# Patient Record
Sex: Female | Born: 1960
Health system: Southern US, Community
[De-identification: ages and names within clinical notes are randomized; demographics above are authoritative.]

## PROBLEM LIST (undated history)

## (undated) DIAGNOSIS — E785 Hyperlipidemia, unspecified: Secondary | ICD-10-CM

## (undated) DIAGNOSIS — I10 Essential (primary) hypertension: Secondary | ICD-10-CM

## (undated) DIAGNOSIS — H348122 Central retinal vein occlusion, left eye, stable: Secondary | ICD-10-CM

## (undated) HISTORY — DX: Hyperlipidemia, unspecified: E78.5

## (undated) HISTORY — DX: Essential (primary) hypertension: I10

## (undated) HISTORY — DX: Central retinal vein occlusion, left eye, stable: H34.8122

---

## 2001-10-16 HISTORY — PX: CHOLECYSTECTOMY: SHX55

## 2006-10-16 HISTORY — PX: MYOMECTOMY: SHX85

## 2011-02-03 DIAGNOSIS — I10 Essential (primary) hypertension: Secondary | ICD-10-CM | POA: Insufficient documentation

## 2011-06-27 DIAGNOSIS — H5210 Myopia, unspecified eye: Secondary | ICD-10-CM | POA: Insufficient documentation

## 2013-12-19 ENCOUNTER — Ambulatory Visit
Admission: RE | Admit: 2013-12-19 | Discharge: 2013-12-19 | Disposition: A | Discharge status: Home / Self Care | Payer: Federal, State, Local not specified - PPO | Source: Ambulatory Visit | Attending: Family Medicine | Admitting: Family Medicine

## 2013-12-19 ENCOUNTER — Other Ambulatory Visit: Payer: Self-pay | Admitting: Family Medicine

## 2013-12-19 DIAGNOSIS — R0989 Other specified symptoms and signs involving the circulatory and respiratory systems: Secondary | ICD-10-CM

## 2014-01-06 ENCOUNTER — Ambulatory Visit
Admission: RE | Admit: 2014-01-06 | Discharge: 2014-01-06 | Disposition: A | Discharge status: Home / Self Care | Payer: Federal, State, Local not specified - PPO | Source: Ambulatory Visit | Attending: Family Medicine | Admitting: Family Medicine

## 2014-01-06 ENCOUNTER — Other Ambulatory Visit: Payer: Self-pay | Admitting: Family Medicine

## 2014-01-06 DIAGNOSIS — J189 Pneumonia, unspecified organism: Secondary | ICD-10-CM

## 2016-03-02 DIAGNOSIS — H1851 Endothelial corneal dystrophy: Secondary | ICD-10-CM | POA: Diagnosis not present

## 2016-03-02 DIAGNOSIS — H5213 Myopia, bilateral: Secondary | ICD-10-CM | POA: Diagnosis not present

## 2016-08-08 DIAGNOSIS — J3089 Other allergic rhinitis: Secondary | ICD-10-CM | POA: Diagnosis not present

## 2016-08-08 DIAGNOSIS — I158 Other secondary hypertension: Secondary | ICD-10-CM | POA: Diagnosis not present

## 2016-08-17 DIAGNOSIS — E042 Nontoxic multinodular goiter: Secondary | ICD-10-CM | POA: Diagnosis not present

## 2016-08-17 DIAGNOSIS — R490 Dysphonia: Secondary | ICD-10-CM | POA: Diagnosis not present

## 2016-10-31 DIAGNOSIS — J3089 Other allergic rhinitis: Secondary | ICD-10-CM | POA: Diagnosis not present

## 2016-10-31 DIAGNOSIS — J310 Chronic rhinitis: Secondary | ICD-10-CM | POA: Insufficient documentation

## 2016-10-31 DIAGNOSIS — Z6833 Body mass index (BMI) 33.0-33.9, adult: Secondary | ICD-10-CM | POA: Diagnosis not present

## 2016-11-09 DIAGNOSIS — J111 Influenza due to unidentified influenza virus with other respiratory manifestations: Secondary | ICD-10-CM | POA: Diagnosis not present

## 2016-11-09 DIAGNOSIS — R05 Cough: Secondary | ICD-10-CM | POA: Diagnosis not present

## 2016-12-06 DIAGNOSIS — J069 Acute upper respiratory infection, unspecified: Secondary | ICD-10-CM | POA: Diagnosis not present

## 2016-12-10 DIAGNOSIS — B309 Viral conjunctivitis, unspecified: Secondary | ICD-10-CM | POA: Diagnosis not present

## 2016-12-10 DIAGNOSIS — J019 Acute sinusitis, unspecified: Secondary | ICD-10-CM | POA: Diagnosis not present

## 2016-12-22 DIAGNOSIS — E785 Hyperlipidemia, unspecified: Secondary | ICD-10-CM | POA: Diagnosis not present

## 2016-12-22 DIAGNOSIS — I10 Essential (primary) hypertension: Secondary | ICD-10-CM | POA: Diagnosis not present

## 2017-03-06 DIAGNOSIS — L908 Other atrophic disorders of skin: Secondary | ICD-10-CM | POA: Diagnosis not present

## 2017-03-06 DIAGNOSIS — D229 Melanocytic nevi, unspecified: Secondary | ICD-10-CM | POA: Diagnosis not present

## 2017-03-06 DIAGNOSIS — L820 Inflamed seborrheic keratosis: Secondary | ICD-10-CM | POA: Diagnosis not present

## 2017-04-05 DIAGNOSIS — H1851 Endothelial corneal dystrophy: Secondary | ICD-10-CM | POA: Diagnosis not present

## 2017-04-05 DIAGNOSIS — H5213 Myopia, bilateral: Secondary | ICD-10-CM | POA: Diagnosis not present

## 2017-09-21 DIAGNOSIS — J310 Chronic rhinitis: Secondary | ICD-10-CM | POA: Diagnosis not present

## 2017-09-21 DIAGNOSIS — J3089 Other allergic rhinitis: Secondary | ICD-10-CM | POA: Diagnosis not present

## 2017-09-21 DIAGNOSIS — Z6833 Body mass index (BMI) 33.0-33.9, adult: Secondary | ICD-10-CM | POA: Diagnosis not present

## 2018-03-01 DIAGNOSIS — I10 Essential (primary) hypertension: Secondary | ICD-10-CM | POA: Diagnosis not present

## 2018-03-01 DIAGNOSIS — Z8379 Family history of other diseases of the digestive system: Secondary | ICD-10-CM | POA: Diagnosis not present

## 2018-03-01 DIAGNOSIS — E785 Hyperlipidemia, unspecified: Secondary | ICD-10-CM | POA: Diagnosis not present

## 2018-03-01 DIAGNOSIS — E669 Obesity, unspecified: Secondary | ICD-10-CM | POA: Diagnosis not present

## 2018-03-06 ENCOUNTER — Other Ambulatory Visit: Payer: Self-pay | Admitting: Family Medicine

## 2018-03-06 DIAGNOSIS — Z8379 Family history of other diseases of the digestive system: Secondary | ICD-10-CM

## 2018-03-21 ENCOUNTER — Other Ambulatory Visit: Payer: Federal, State, Local not specified - PPO

## 2018-03-26 ENCOUNTER — Ambulatory Visit
Admission: RE | Admit: 2018-03-26 | Discharge: 2018-03-26 | Disposition: A | Discharge status: Home / Self Care | Payer: Federal, State, Local not specified - PPO | Source: Ambulatory Visit | Attending: Family Medicine | Admitting: Family Medicine

## 2018-03-26 DIAGNOSIS — Z8379 Family history of other diseases of the digestive system: Secondary | ICD-10-CM

## 2018-03-26 DIAGNOSIS — K760 Fatty (change of) liver, not elsewhere classified: Secondary | ICD-10-CM | POA: Diagnosis not present

## 2018-06-11 DIAGNOSIS — E785 Hyperlipidemia, unspecified: Secondary | ICD-10-CM | POA: Diagnosis not present

## 2018-08-13 DIAGNOSIS — Z79899 Other long term (current) drug therapy: Secondary | ICD-10-CM | POA: Diagnosis not present

## 2018-08-13 DIAGNOSIS — E785 Hyperlipidemia, unspecified: Secondary | ICD-10-CM | POA: Diagnosis not present

## 2018-09-17 DIAGNOSIS — H34832 Tributary (branch) retinal vein occlusion, left eye, with macular edema: Secondary | ICD-10-CM | POA: Diagnosis not present

## 2018-09-17 DIAGNOSIS — H1851 Endothelial corneal dystrophy: Secondary | ICD-10-CM | POA: Diagnosis not present

## 2018-09-18 DIAGNOSIS — I10 Essential (primary) hypertension: Secondary | ICD-10-CM | POA: Diagnosis not present

## 2018-09-26 DIAGNOSIS — H1851 Endothelial corneal dystrophy: Secondary | ICD-10-CM | POA: Diagnosis not present

## 2018-09-26 DIAGNOSIS — H34832 Tributary (branch) retinal vein occlusion, left eye, with macular edema: Secondary | ICD-10-CM | POA: Diagnosis not present

## 2018-09-27 DIAGNOSIS — S0502XA Injury of conjunctiva and corneal abrasion without foreign body, left eye, initial encounter: Secondary | ICD-10-CM | POA: Diagnosis not present

## 2018-09-27 DIAGNOSIS — H34832 Tributary (branch) retinal vein occlusion, left eye, with macular edema: Secondary | ICD-10-CM | POA: Diagnosis not present

## 2018-10-04 DIAGNOSIS — I10 Essential (primary) hypertension: Secondary | ICD-10-CM | POA: Diagnosis not present

## 2018-10-04 DIAGNOSIS — E785 Hyperlipidemia, unspecified: Secondary | ICD-10-CM | POA: Diagnosis not present

## 2018-10-07 DIAGNOSIS — Z8249 Family history of ischemic heart disease and other diseases of the circulatory system: Secondary | ICD-10-CM | POA: Diagnosis not present

## 2018-10-07 DIAGNOSIS — I10 Essential (primary) hypertension: Secondary | ICD-10-CM | POA: Diagnosis not present

## 2018-10-07 DIAGNOSIS — Z0189 Encounter for other specified special examinations: Secondary | ICD-10-CM | POA: Diagnosis not present

## 2018-10-07 DIAGNOSIS — E782 Mixed hyperlipidemia: Secondary | ICD-10-CM | POA: Diagnosis not present

## 2018-11-01 DIAGNOSIS — Z8249 Family history of ischemic heart disease and other diseases of the circulatory system: Secondary | ICD-10-CM | POA: Diagnosis not present

## 2018-11-01 DIAGNOSIS — I10 Essential (primary) hypertension: Secondary | ICD-10-CM | POA: Diagnosis not present

## 2018-11-07 DIAGNOSIS — H43393 Other vitreous opacities, bilateral: Secondary | ICD-10-CM | POA: Diagnosis not present

## 2018-11-07 DIAGNOSIS — H1851 Endothelial corneal dystrophy: Secondary | ICD-10-CM | POA: Diagnosis not present

## 2018-11-07 DIAGNOSIS — H34832 Tributary (branch) retinal vein occlusion, left eye, with macular edema: Secondary | ICD-10-CM | POA: Diagnosis not present

## 2018-11-08 DIAGNOSIS — E785 Hyperlipidemia, unspecified: Secondary | ICD-10-CM | POA: Diagnosis not present

## 2018-11-08 DIAGNOSIS — Z0189 Encounter for other specified special examinations: Secondary | ICD-10-CM | POA: Diagnosis not present

## 2018-11-08 DIAGNOSIS — Z23 Encounter for immunization: Secondary | ICD-10-CM | POA: Diagnosis not present

## 2018-11-08 DIAGNOSIS — Z8249 Family history of ischemic heart disease and other diseases of the circulatory system: Secondary | ICD-10-CM | POA: Diagnosis not present

## 2018-11-08 DIAGNOSIS — I10 Essential (primary) hypertension: Secondary | ICD-10-CM | POA: Diagnosis not present

## 2018-11-08 DIAGNOSIS — E782 Mixed hyperlipidemia: Secondary | ICD-10-CM | POA: Diagnosis not present

## 2018-11-08 DIAGNOSIS — R739 Hyperglycemia, unspecified: Secondary | ICD-10-CM | POA: Diagnosis not present

## 2018-11-29 ENCOUNTER — Other Ambulatory Visit: Payer: Self-pay | Admitting: Cardiology

## 2018-11-29 DIAGNOSIS — I10 Essential (primary) hypertension: Secondary | ICD-10-CM

## 2018-12-04 ENCOUNTER — Other Ambulatory Visit (HOSPITAL_COMMUNITY): Payer: Self-pay | Admitting: Cardiology

## 2018-12-04 DIAGNOSIS — I10 Essential (primary) hypertension: Secondary | ICD-10-CM | POA: Diagnosis not present

## 2018-12-05 DIAGNOSIS — H1851 Endothelial corneal dystrophy: Secondary | ICD-10-CM | POA: Diagnosis not present

## 2018-12-05 DIAGNOSIS — H43393 Other vitreous opacities, bilateral: Secondary | ICD-10-CM | POA: Diagnosis not present

## 2018-12-05 DIAGNOSIS — H34832 Tributary (branch) retinal vein occlusion, left eye, with macular edema: Secondary | ICD-10-CM | POA: Diagnosis not present

## 2018-12-05 LAB — BASIC METABOLIC PANEL
BUN/Creatinine Ratio: 18 (ref 9–23)
BUN: 12 mg/dL (ref 6–24)
CO2: 26 mmol/L (ref 20–29)
Calcium: 9.6 mg/dL (ref 8.7–10.2)
Chloride: 101 mmol/L (ref 96–106)
Creatinine, Ser: 0.65 mg/dL (ref 0.57–1.00)
GFR, EST AFRICAN AMERICAN: 114 mL/min/{1.73_m2} (ref >59)
GFR, EST NON AFRICAN AMERICAN: 99 mL/min/{1.73_m2} (ref >59)
Glucose: 103 mg/dL — ABNORMAL HIGH (ref 65–99)
Potassium: 4.4 mmol/L (ref 3.5–5.2)
Sodium: 140 mmol/L (ref 134–144)

## 2018-12-06 ENCOUNTER — Encounter: Payer: Self-pay | Admitting: Cardiology

## 2018-12-06 ENCOUNTER — Ambulatory Visit: Payer: Federal, State, Local not specified - PPO | Admitting: Cardiology

## 2018-12-06 VITALS — BP 141/61 | HR 64 | Ht 67.0 in | Wt 221.4 lb

## 2018-12-06 DIAGNOSIS — I10 Essential (primary) hypertension: Secondary | ICD-10-CM

## 2018-12-06 DIAGNOSIS — Z8249 Family history of ischemic heart disease and other diseases of the circulatory system: Secondary | ICD-10-CM | POA: Diagnosis not present

## 2018-12-06 DIAGNOSIS — E782 Mixed hyperlipidemia: Secondary | ICD-10-CM | POA: Diagnosis not present

## 2018-12-06 NOTE — Progress Notes (Signed)
Patient is here for follow up visit.  Subjective:   Autumn Flores, female    DOB: [birth year], 58 y.o.   MRN: [id number]   Chief Complaint  Patient presents with  . Hypertension    4 week F/U     HPI  58 y/o Caucasian female with hypertension, controlled hyperlipidemia, family h/o CAD, retinal vein branch occlusion.   At last visit in 10/2018, I increased her lisinopril to 20 mg daily and continue labetalol 100 mg bid. Recent BMP is reassuring. Dr Abigail Miyamoto increased Lipitor from 10 mg to 20 mg daily.   Patient is here today for hypertension follow up. Blood pressure is improved. She is taking lipitor 20 mg daily and wonders if this is causing her to have fatigue, but denies myalgia.  Past Medical History:  Diagnosis Date  . Hyperlipidemia   . Hypertension   . Retinal vein occlusion of left eye      Past Surgical History:  Procedure Laterality Date  . CHOLECYSTECTOMY  2003  . MYOMECTOMY  2008     Social History   Socioeconomic History  . Marital status: Married    Spouse name: Not on file  . Number of children: Not on file  . Years of education: Not on file  . Highest education level: Not on file  Occupational History  . Not on file  Social Needs  . Financial resource strain: Not on file  . Food insecurity:    Worry: Not on file    Inability: Not on file  . Transportation needs:    Medical: Not on file    Non-medical: Not on file  Tobacco Use  . Smoking status: Not on file  Substance and Sexual Activity  . Alcohol use: Not on file  . Drug use: Not on file  . Sexual activity: Not on file  Lifestyle  . Physical activity:    Days per week: Not on file    Minutes per session: Not on file  . Stress: Not on file  Relationships  . Social connections:    Talks on phone: Not on file    Gets together: Not on file    Attends religious service: Not on file    Active member of club or organization: Not on file    Attends meetings of clubs or  organizations: Not on file    Relationship status: Not on file  . Intimate partner violence:    Fear of current or ex partner: Not on file    Emotionally abused: Not on file    Physically abused: Not on file    Forced sexual activity: Not on file  Other Topics Concern  . Not on file  Social History Narrative  . Not on file     Current Outpatient Medications on File Prior to Visit  Medication Sig Dispense Refill  . atorvastatin (LIPITOR) 20 MG tablet Take 1 tablet by mouth daily.     Marland Kitchen labetalol (NORMODYNE) 100 MG tablet Take 1 tablet by mouth 2 (two) times daily.    Marland Kitchen lisinopril (PRINIVIL,ZESTRIL) 20 MG tablet Take 1 tablet by mouth every evening.    . Nasal Moisturizer Combination (OCEAN COMPLETE SINUS RINSE NA) Place 1 spray into the nose as needed.     No current facility-administered medications on file prior to visit.     Cardiovascular studies:  Echocardiogram 11/01/2018: Left ventricle cavity is normal in size. Mild concentric hypertrophy of the left ventricle. Normal global wall motion. Normal diastolic  filling pattern. Calculated EF 67%. Structurally normal mitral valve with mild (Grade I) regurgitation. Trace mitral valve stenosis. Structurally normal tricuspid valve with trace regurgitation. No evidence of pulmonary hypertension.  Review of Systems  Constitution: Positive for malaise/fatigue. Negative for decreased appetite, weight gain and weight loss.  HENT: Negative for congestion.   Eyes: Negative for visual disturbance.  Cardiovascular: Negative for chest pain, dyspnea on exertion, leg swelling, palpitations and syncope.  Respiratory: Negative for shortness of breath.   Endocrine: Negative for cold intolerance.  Hematologic/Lymphatic: Does not bruise/bleed easily.  Skin: Negative for itching and rash.  Musculoskeletal: Negative for myalgias.  Gastrointestinal: Negative for abdominal pain, nausea and vomiting.  Genitourinary: Negative for dysuria.    Neurological: Negative for dizziness and weakness.  Psychiatric/Behavioral: The patient is not nervous/anxious.   All other systems reviewed and are negative.      Objective:    Vitals:   12/06/18 1407  BP: (!) 141/61  Pulse: 64  SpO2: 99%     Physical Exam  Constitutional: She is oriented to person, place, and time. She appears well-developed and well-nourished. No distress.  HENT:  Head: Normocephalic and atraumatic.  Eyes: Pupils are equal, round, and reactive to light. Conjunctivae are normal.  Neck: No JVD present.  Cardiovascular: Normal rate, regular rhythm and intact distal pulses.  Pulmonary/Chest: Effort normal and breath sounds normal. She has no wheezes. She has no rales.  Abdominal: Soft. Bowel sounds are normal. There is no rebound.  Musculoskeletal:        General: No edema.  Lymphadenopathy:    She has no cervical adenopathy.  Neurological: She is alert and oriented to person, place, and time. No cranial nerve deficit.  Skin: Skin is warm and dry.  Psychiatric: She has a normal mood and affect.  Nursing note and vitals reviewed.       Assessment & Recommendations:   58 y/o Caucasian female with hypertension, controlled hyperlipidemia, family h/o CAD, retinal vein branch occlusion.   1. Essential hypertension Better controlled. Continue current antihypertensive therapy.   2. Family history of early CAD Recommend primary prevention. She may obtain calcium score for risk stratification at some point.  3. Mixed hyperlipidemia May reduce lipitor to 10 mg daily to see if symptoms improve.   I will see her back in 6 months,    Lonald Troiani Emiliano Dyer, MD [facility] Pager: [phone] Office: [phone] If no answer Cell [phone]

## 2018-12-27 DIAGNOSIS — I10 Essential (primary) hypertension: Secondary | ICD-10-CM | POA: Diagnosis not present

## 2018-12-27 DIAGNOSIS — E669 Obesity, unspecified: Secondary | ICD-10-CM | POA: Diagnosis not present

## 2018-12-27 DIAGNOSIS — E785 Hyperlipidemia, unspecified: Secondary | ICD-10-CM | POA: Diagnosis not present

## 2019-01-21 DIAGNOSIS — H34832 Tributary (branch) retinal vein occlusion, left eye, with macular edema: Secondary | ICD-10-CM | POA: Diagnosis not present

## 2019-02-05 ENCOUNTER — Other Ambulatory Visit: Payer: Self-pay | Admitting: Cardiology

## 2019-02-20 DIAGNOSIS — H1851 Endothelial corneal dystrophy: Secondary | ICD-10-CM | POA: Diagnosis not present

## 2019-02-20 DIAGNOSIS — H34832 Tributary (branch) retinal vein occlusion, left eye, with macular edema: Secondary | ICD-10-CM | POA: Diagnosis not present

## 2019-02-20 DIAGNOSIS — H43393 Other vitreous opacities, bilateral: Secondary | ICD-10-CM | POA: Diagnosis not present

## 2019-03-20 DIAGNOSIS — H2513 Age-related nuclear cataract, bilateral: Secondary | ICD-10-CM | POA: Diagnosis not present

## 2019-03-20 DIAGNOSIS — H34832 Tributary (branch) retinal vein occlusion, left eye, with macular edema: Secondary | ICD-10-CM | POA: Diagnosis not present

## 2019-03-20 DIAGNOSIS — H43393 Other vitreous opacities, bilateral: Secondary | ICD-10-CM | POA: Diagnosis not present

## 2019-03-20 DIAGNOSIS — H1851 Endothelial corneal dystrophy: Secondary | ICD-10-CM | POA: Diagnosis not present

## 2019-03-27 DIAGNOSIS — E785 Hyperlipidemia, unspecified: Secondary | ICD-10-CM | POA: Diagnosis not present

## 2019-03-27 DIAGNOSIS — I10 Essential (primary) hypertension: Secondary | ICD-10-CM | POA: Diagnosis not present

## 2019-04-24 DIAGNOSIS — H1851 Endothelial corneal dystrophy: Secondary | ICD-10-CM | POA: Diagnosis not present

## 2019-04-24 DIAGNOSIS — H52203 Unspecified astigmatism, bilateral: Secondary | ICD-10-CM | POA: Diagnosis not present

## 2019-04-24 DIAGNOSIS — H34832 Tributary (branch) retinal vein occlusion, left eye, with macular edema: Secondary | ICD-10-CM | POA: Diagnosis not present

## 2019-04-24 DIAGNOSIS — H524 Presbyopia: Secondary | ICD-10-CM | POA: Diagnosis not present

## 2019-04-24 DIAGNOSIS — H5213 Myopia, bilateral: Secondary | ICD-10-CM | POA: Diagnosis not present

## 2019-04-24 DIAGNOSIS — H269 Unspecified cataract: Secondary | ICD-10-CM | POA: Diagnosis not present

## 2019-04-24 DIAGNOSIS — H43393 Other vitreous opacities, bilateral: Secondary | ICD-10-CM | POA: Diagnosis not present

## 2019-05-12 ENCOUNTER — Telehealth: Payer: Self-pay

## 2019-05-12 NOTE — Telephone Encounter (Signed)
Pt called stating that lipitor is raising her blood sugar 40pts. Taking 10mg  1/2 qd and wants to change to something that wont effect her BS.//ah

## 2019-05-12 NOTE — Telephone Encounter (Signed)
Pt aware.//ah

## 2019-05-12 NOTE — Telephone Encounter (Signed)
Let's hold lipitor for time being. I will discuss further at next appt once we see blood sugar off lipitor, and then discuss further. (Appt on 8/27).  Thanks MJP

## 2019-06-12 ENCOUNTER — Ambulatory Visit: Payer: Federal, State, Local not specified - PPO | Admitting: Cardiology

## 2019-06-12 DIAGNOSIS — H34832 Tributary (branch) retinal vein occlusion, left eye, with macular edema: Secondary | ICD-10-CM | POA: Diagnosis not present

## 2019-06-12 DIAGNOSIS — H2513 Age-related nuclear cataract, bilateral: Secondary | ICD-10-CM | POA: Diagnosis not present

## 2019-06-12 DIAGNOSIS — H3581 Retinal edema: Secondary | ICD-10-CM | POA: Diagnosis not present

## 2019-06-12 DIAGNOSIS — H43393 Other vitreous opacities, bilateral: Secondary | ICD-10-CM | POA: Diagnosis not present

## 2019-07-18 ENCOUNTER — Encounter: Payer: Self-pay | Admitting: Cardiology

## 2019-07-18 ENCOUNTER — Other Ambulatory Visit: Payer: Self-pay

## 2019-07-18 ENCOUNTER — Ambulatory Visit: Payer: Federal, State, Local not specified - PPO | Admitting: Cardiology

## 2019-07-18 VITALS — BP 132/65 | HR 73 | Temp 97.8°F | Ht 66.0 in | Wt 221.0 lb

## 2019-07-18 DIAGNOSIS — R7309 Other abnormal glucose: Secondary | ICD-10-CM

## 2019-07-18 DIAGNOSIS — I10 Essential (primary) hypertension: Secondary | ICD-10-CM

## 2019-07-18 DIAGNOSIS — E782 Mixed hyperlipidemia: Secondary | ICD-10-CM | POA: Diagnosis not present

## 2019-07-18 NOTE — Progress Notes (Signed)
Patient is here for follow up visit.  Subjective:   Autumn Flores, female    DOB: 1961/01/27, 58 y.o.   MRN: [id number]   Chief Complaint  Patient presents with  . Hypertension  . Follow-up    6 month     HPI  58 y/o Caucasian female with hypertension, controlled hyperlipidemia, family h/o CAD, retinal vein branch occlusion.   Patient noticed that Lipitor was raising her blood sugar levels, thus stopped. Patient remains concerned about her cholesterol given strong family h/o CAD. She has now been off Lipitor for over a month. She denies chest pain, shortness of breath, palpitations, leg edema, orthopnea, PND, TIA/syncope. Blood pressure is well controlled.     Past Medical History:  Diagnosis Date  . Hyperlipidemia   . Hypertension   . Retinal vein occlusion of left eye      Past Surgical History:  Procedure Laterality Date  . CHOLECYSTECTOMY  2003  . MYOMECTOMY  2008     Social History   Socioeconomic History  . Marital status: Married    Spouse name: Not on file  . Number of children: 1  . Years of education: Not on file  . Highest education level: Not on file  Occupational History  . Not on file  Social Needs  . Financial resource strain: Not on file  . Food insecurity    Worry: Not on file    Inability: Not on file  . Transportation needs    Medical: Not on file    Non-medical: Not on file  Tobacco Use  . Smoking status: Never Smoker  . Smokeless tobacco: Former Network engineer and Sexual Activity  . Alcohol use: Yes    Comment: occasional   . Drug use: Never  . Sexual activity: Not on file  Lifestyle  . Physical activity    Days per week: Not on file    Minutes per session: Not on file  . Stress: Not on file  Relationships  . Social Herbalist on phone: Not on file    Gets together: Not on file    Attends religious service: Not on file    Active member of club or organization: Not on file    Attends meetings of clubs or  organizations: Not on file    Relationship status: Not on file  . Intimate partner violence    Fear of current or ex partner: Not on file    Emotionally abused: Not on file    Physically abused: Not on file    Forced sexual activity: Not on file  Other Topics Concern  . Not on file  Social History Narrative  . Not on file     Current Outpatient Medications on File Prior to Visit  Medication Sig Dispense Refill  . atorvastatin (LIPITOR) 20 MG tablet Take 1 tablet by mouth daily.     Marland Kitchen labetalol (NORMODYNE) 100 MG tablet Take 1 tablet by mouth 2 (two) times daily.    Marland Kitchen lisinopril (ZESTRIL) 20 MG tablet TAKE 1 TABLET BY MOUTH EVERY NIGHT 90 tablet 1  . Nasal Moisturizer Combination (OCEAN COMPLETE SINUS RINSE NA) Place 1 spray into the nose as needed.     No current facility-administered medications on file prior to visit.     Cardiovascular studies:  EKG 07/18/2019: Sinus rhythm 73 bpm.  Poor R wave progression. Otherwise normal EKG.   Echocardiogram 11/01/2018: Left ventricle cavity is normal in size. Mild concentric hypertrophy  of the left ventricle. Normal global wall motion. Normal diastolic filling pattern. Calculated EF 67%. Structurally normal mitral valve with mild (Grade I) regurgitation. Trace mitral valve stenosis. Structurally normal tricuspid valve with trace regurgitation. No evidence of pulmonary hypertension.  Labs: Results for Autumn, Flores (MRN [id number]) as of 07/18/2019 09:11  Ref. Range 12/04/2018 12:57  Sodium Latest Ref Range: 134 - 144 mmol/L 140  Potassium Latest Ref Range: 3.5 - 5.2 mmol/L 4.4  Chloride Latest Ref Range: 96 - 106 mmol/L 101  CO2 Latest Ref Range: 20 - 29 mmol/L 26  Glucose Latest Ref Range: 65 - 99 mg/dL 765 (H)  BUN Latest Ref Range: 6 - 24 mg/dL 12  Creatinine Latest Ref Range: 0.57 - 1.00 mg/dL 4.65  Calcium Latest Ref Range: 8.7 - 10.2 mg/dL 9.6  BUN/Creatinine Ratio Latest Ref Range: 9 - 23  18  GFR, Est Non African  American Latest Ref Range: >59 mL/min/1.73 99  GFR, Est African American Latest Ref Range: >59 mL/min/1.73 114   08/13/2018: Chol 165, TG 99, HDL 52, LDL 93  Review of Systems  Constitution: Positive for malaise/fatigue. Negative for decreased appetite, weight gain and weight loss.  HENT: Negative for congestion.   Eyes: Negative for visual disturbance.  Cardiovascular: Negative for chest pain, dyspnea on exertion, leg swelling, palpitations and syncope.  Respiratory: Negative for shortness of breath.   Endocrine: Negative for cold intolerance.  Hematologic/Lymphatic: Does not bruise/bleed easily.  Skin: Negative for itching and rash.  Musculoskeletal: Negative for myalgias.  Gastrointestinal: Negative for abdominal pain, nausea and vomiting.  Genitourinary: Negative for dysuria.  Neurological: Negative for dizziness and weakness.  Psychiatric/Behavioral: The patient is not nervous/anxious.   All other systems reviewed and are negative.      Objective:    Vitals:   07/18/19 1134  BP: 132/65  Pulse: 73  Temp: 97.8 F (36.6 C)  SpO2: 98%     Physical Exam  Constitutional: She is oriented to person, place, and time. She appears well-developed and well-nourished. No distress.  HENT:  Head: Normocephalic and atraumatic.  Eyes: Pupils are equal, round, and reactive to light. Conjunctivae are normal.  Neck: No JVD present.  Cardiovascular: Normal rate, regular rhythm and intact distal pulses.  Pulmonary/Chest: Effort normal and breath sounds normal. She has no wheezes. She has no rales.  Abdominal: Soft. Bowel sounds are normal. There is no rebound.  Musculoskeletal:        General: No edema.  Lymphadenopathy:    She has no cervical adenopathy.  Neurological: She is alert and oriented to person, place, and time. No cranial nerve deficit.  Skin: Skin is warm and dry.  Psychiatric: She has a normal mood and affect.  Nursing note and vitals reviewed.       Assessment &  Recommendations:   58 y/o Caucasian female with hypertension, controlled hyperlipidemia, family h/o CAD, retinal vein branch occlusion.   1. Essential hypertension Better controlled. Continue current antihypertensive therapy.   2. Family history of early CAD Recommend primary prevention.   3. Mixed hyperlipidemia Lipitor stopped due to hyperglycemia. Will check A1C, lipid panel now. If suboptimal, will consider zetia or low intensity statin.  F/u in 4 months.   Elder Negus, MD [facility] Pager: [phone] Office: [phone] If no answer Cell [phone]

## 2019-07-31 DIAGNOSIS — H3581 Retinal edema: Secondary | ICD-10-CM | POA: Diagnosis not present

## 2019-07-31 DIAGNOSIS — H2513 Age-related nuclear cataract, bilateral: Secondary | ICD-10-CM | POA: Diagnosis not present

## 2019-07-31 DIAGNOSIS — H34832 Tributary (branch) retinal vein occlusion, left eye, with macular edema: Secondary | ICD-10-CM | POA: Diagnosis not present

## 2019-07-31 DIAGNOSIS — H18513 Endothelial corneal dystrophy, bilateral: Secondary | ICD-10-CM | POA: Diagnosis not present

## 2019-07-31 DIAGNOSIS — H43393 Other vitreous opacities, bilateral: Secondary | ICD-10-CM | POA: Diagnosis not present

## 2019-08-01 ENCOUNTER — Other Ambulatory Visit: Payer: Self-pay | Admitting: Cardiology

## 2019-08-22 DIAGNOSIS — I10 Essential (primary) hypertension: Secondary | ICD-10-CM | POA: Diagnosis not present

## 2019-08-22 DIAGNOSIS — E785 Hyperlipidemia, unspecified: Secondary | ICD-10-CM | POA: Diagnosis not present

## 2019-08-22 DIAGNOSIS — R739 Hyperglycemia, unspecified: Secondary | ICD-10-CM | POA: Diagnosis not present

## 2019-08-22 DIAGNOSIS — Z23 Encounter for immunization: Secondary | ICD-10-CM | POA: Diagnosis not present

## 2019-09-05 DIAGNOSIS — Z79899 Other long term (current) drug therapy: Secondary | ICD-10-CM | POA: Diagnosis not present

## 2019-09-05 DIAGNOSIS — I10 Essential (primary) hypertension: Secondary | ICD-10-CM | POA: Diagnosis not present

## 2019-09-05 DIAGNOSIS — R739 Hyperglycemia, unspecified: Secondary | ICD-10-CM | POA: Diagnosis not present

## 2019-09-05 DIAGNOSIS — E785 Hyperlipidemia, unspecified: Secondary | ICD-10-CM | POA: Diagnosis not present

## 2019-09-18 ENCOUNTER — Telehealth: Payer: Self-pay

## 2019-09-18 NOTE — Telephone Encounter (Signed)
I dit not receive lab results. Can you get them from PCP please?  Thanks MJP

## 2019-09-18 NOTE — Telephone Encounter (Signed)
[place] they will send it over

## 2019-09-18 NOTE — Telephone Encounter (Signed)
Pt called to ask if you have received her lab results. Pt also mention that her PCP wants to put her on a Statin medication but pt does not want to take statin med. Please advise

## 2019-09-19 DIAGNOSIS — E782 Mixed hyperlipidemia: Secondary | ICD-10-CM

## 2019-09-19 NOTE — Progress Notes (Signed)
09/05/2019: Glucose 121, BUN/Cr 16/0.71. EGFR 84. Na/K 139/4.5. Rest of the CMP normal HbA1C 5.7% Chol 246, TG 122, HDL 55, LDL 166  Lipitor stopped due to hyperglycemia, suspected to be secondary to Lipitor use.  03/27/2019: Glucose 131, BUN/Cr 12/0.69. EGFR 87. Na/K 140/4.1. Rest of the CMP normal Chol 175, TG 159, HDL 47, LDL 96  11/08/2018: Glucose 106 HbA1C 5.5% Chol 167, TG 116, HDL 52, LDL 91  09/18/2018: Glucose 113  08/13/2018: Chol 165, TG 105, HDL 52, LDL 153. Rest of the CMP normal  03/01/2028: Glucose 97, BUN/Cr 15/0.69. EGFR 88. Na/K 140/4.3. Rest of the CMP normal   Reviewed historical data regarding blood glucose, hemoglobin A1c, and lipid panel with the patient.  Patient was taken off atorvastatin due to concern of hyperglycemia.  She is in prediabetic range with random blood sugar in 120s, but fasting blood sugar in 90s, according to the patient.  Her LDL has increased from 96 to 166 without a statin.  Dr. Sheryn Bison has started patient on pravastatin.  This is a reasonable alternative that would not increase blood sugar.  I expect modest improvement in LDL with pravastatin alone.  I will repeat lipid panel in January, before follow-up with me in February.  At that time, we will discuss adding Zetia versus going back to a higher intensity statin.  Nigel Mormon, MD

## 2019-09-25 DIAGNOSIS — H2513 Age-related nuclear cataract, bilateral: Secondary | ICD-10-CM | POA: Diagnosis not present

## 2019-09-25 DIAGNOSIS — H34832 Tributary (branch) retinal vein occlusion, left eye, with macular edema: Secondary | ICD-10-CM | POA: Diagnosis not present

## 2019-09-25 DIAGNOSIS — H43393 Other vitreous opacities, bilateral: Secondary | ICD-10-CM | POA: Diagnosis not present

## 2019-09-29 ENCOUNTER — Other Ambulatory Visit: Payer: Self-pay | Admitting: Family Medicine

## 2019-09-29 DIAGNOSIS — Z1231 Encounter for screening mammogram for malignant neoplasm of breast: Secondary | ICD-10-CM

## 2019-11-18 ENCOUNTER — Ambulatory Visit
Admission: RE | Admit: 2019-11-18 | Discharge: 2019-11-18 | Disposition: A | Discharge status: Home / Self Care | Payer: Federal, State, Local not specified - PPO | Source: Ambulatory Visit | Attending: Family Medicine | Admitting: Family Medicine

## 2019-11-18 ENCOUNTER — Other Ambulatory Visit: Payer: Self-pay

## 2019-11-18 DIAGNOSIS — Z1231 Encounter for screening mammogram for malignant neoplasm of breast: Secondary | ICD-10-CM | POA: Diagnosis not present

## 2019-11-20 ENCOUNTER — Ambulatory Visit: Payer: Federal, State, Local not specified - PPO | Admitting: Cardiology

## 2019-11-20 NOTE — Progress Notes (Deleted)
Patient is here for follow up visit.  Subjective:   Autumn Flores, female    DOB: 1961-09-26, 59 y.o.   MRN: [id number]  *** No chief complaint on file.    HPI  59 y/o Caucasian female with hypertension, controlled hyperlipidemia, family h/o CAD, retinal vein branch occlusion.   09/2019: I reviewed historical data regarding blood glucose, hemoglobin A1c, and lipid panel with the patient.  Patient was taken off atorvastatin due to concern of hyperglycemia.  She is in prediabetic range with random blood sugar in 120s, but fasting blood sugar in 90s, according to the patient.  Her LDL has increased from 96 to 166 without a statin.  Dr. Sheryn Bison has started patient on pravastatin.  This is a reasonable alternative that would not increase blood sugar.  I expect modest improvement in LDL with pravastatin alone.  I will repeat lipid panel in January, before follow-up with me in February.  At that time, we will discuss adding Zetia versus going back to a higher intensity statin.    Current Outpatient Medications on File Prior to Visit  Medication Sig Dispense Refill  . labetalol (NORMODYNE) 100 MG tablet Take 1 tablet by mouth 2 (two) times daily.    Marland Kitchen lisinopril (ZESTRIL) 20 MG tablet TAKE 1 TABLET BY MOUTH EVERY NIGHT 90 tablet 1  . Nasal Moisturizer Combination (OCEAN COMPLETE SINUS RINSE NA) Place 1 spray into the nose as needed.     No current facility-administered medications on file prior to visit.    Cardiovascular studies:  EKG 07/18/2019: Sinus rhythm 73 bpm.  Poor R wave progression. Otherwise normal EKG.   Echocardiogram 11/01/2018: Left ventricle cavity is normal in size. Mild concentric hypertrophy of the left ventricle. Normal global wall motion. Normal diastolic filling pattern. Calculated EF 67%. Structurally normal mitral valve with mild (Grade I) regurgitation. Trace mitral valve stenosis. Structurally normal tricuspid valve with trace regurgitation. No evidence  of pulmonary hypertension.  Labs: 09/05/2019: Glucose 121, BUN/Cr 16/0.71. EGFR 84. Na/K 139/4.5. Rest of the CMP normal HbA1C 5.7% Chol 246, TG 122, HDL 55, LDL 166  Lipitor stopped due to hyperglycemia, suspected to be secondary to Lipitor use.  03/27/2019: Glucose 131, BUN/Cr 12/0.69. EGFR 87. Na/K 140/4.1. Rest of the CMP normal Chol 175, TG 159, HDL 47, LDL 96  11/08/2018: Glucose 106 HbA1C 5.5% Chol 167, TG 116, HDL 52, LDL 91  09/18/2018: Glucose 113  08/13/2018: Chol 165, TG 105, HDL 52, LDL 153. Rest of the CMP normal  03/01/2028: Glucose 97, BUN/Cr 15/0.69. EGFR 88. Na/K 140/4.3. Rest of the CMP normal     Review of Systems  Constitution: Positive for malaise/fatigue. Negative for decreased appetite, weight gain and weight loss.  HENT: Negative for congestion.   Eyes: Negative for visual disturbance.  Cardiovascular: Negative for chest pain, dyspnea on exertion, leg swelling, palpitations and syncope.  Respiratory: Negative for shortness of breath.   Endocrine: Negative for cold intolerance.  Hematologic/Lymphatic: Does not bruise/bleed easily.  Skin: Negative for itching and rash.  Musculoskeletal: Negative for myalgias.  Gastrointestinal: Negative for abdominal pain, nausea and vomiting.  Genitourinary: Negative for dysuria.  Neurological: Negative for dizziness and weakness.  Psychiatric/Behavioral: The patient is not nervous/anxious.   All other systems reviewed and are negative.      Objective:   *** There were no vitals filed for this visit.   Physical Exam  Constitutional: She is oriented to person, place, and time. She appears well-developed and well-nourished. No distress.  HENT:  Head: Normocephalic and atraumatic.  Eyes: Pupils are equal, round, and reactive to light. Conjunctivae are normal.  Neck: No JVD present.  Cardiovascular: Normal rate, regular rhythm and intact distal pulses.  Pulmonary/Chest: Effort normal and breath sounds  normal. She has no wheezes. She has no rales.  Abdominal: Soft. Bowel sounds are normal. There is no rebound.  Musculoskeletal:        General: No edema.  Lymphadenopathy:    She has no cervical adenopathy.  Neurological: She is alert and oriented to person, place, and time. No cranial nerve deficit.  Skin: Skin is warm and dry.  Psychiatric: She has a normal mood and affect.  Nursing note and vitals reviewed.       Assessment & Recommendations:   59 y/o Caucasian female with hypertension, controlled hyperlipidemia, family h/o CAD, retinal vein branch occlusion.   1. Essential hypertension Better controlled. Continue current antihypertensive therapy.   2. Family history of early CAD Recommend primary prevention.   3. Mixed hyperlipidemia Lipitor stopped due to hyperglycemia. Will check A1C, lipid panel now. If suboptimal, will consider zetia or low intensity statin.  F/u in 4 months.   Nigel Mormon, MD [facility] Pager: [phone] Office: [phone] If no answer Cell [phone]

## 2019-11-27 DIAGNOSIS — H34832 Tributary (branch) retinal vein occlusion, left eye, with macular edema: Secondary | ICD-10-CM | POA: Diagnosis not present

## 2019-11-27 DIAGNOSIS — H43393 Other vitreous opacities, bilateral: Secondary | ICD-10-CM | POA: Diagnosis not present

## 2019-11-27 DIAGNOSIS — H2513 Age-related nuclear cataract, bilateral: Secondary | ICD-10-CM | POA: Diagnosis not present

## 2019-11-27 DIAGNOSIS — H18513 Endothelial corneal dystrophy, bilateral: Secondary | ICD-10-CM | POA: Diagnosis not present

## 2019-11-28 DIAGNOSIS — Z79899 Other long term (current) drug therapy: Secondary | ICD-10-CM | POA: Diagnosis not present

## 2019-11-28 DIAGNOSIS — E785 Hyperlipidemia, unspecified: Secondary | ICD-10-CM | POA: Diagnosis not present

## 2020-01-11 ENCOUNTER — Other Ambulatory Visit: Payer: Self-pay | Admitting: Cardiology

## 2020-01-29 DIAGNOSIS — H34832 Tributary (branch) retinal vein occlusion, left eye, with macular edema: Secondary | ICD-10-CM | POA: Diagnosis not present

## 2020-02-06 DIAGNOSIS — M25462 Effusion, left knee: Secondary | ICD-10-CM | POA: Diagnosis not present

## 2020-02-06 DIAGNOSIS — I10 Essential (primary) hypertension: Secondary | ICD-10-CM | POA: Diagnosis not present

## 2020-02-06 DIAGNOSIS — M1712 Unilateral primary osteoarthritis, left knee: Secondary | ICD-10-CM | POA: Diagnosis not present

## 2020-02-06 DIAGNOSIS — M25562 Pain in left knee: Secondary | ICD-10-CM | POA: Diagnosis not present

## 2020-02-06 DIAGNOSIS — M25461 Effusion, right knee: Secondary | ICD-10-CM | POA: Diagnosis not present

## 2020-04-01 DIAGNOSIS — M1712 Unilateral primary osteoarthritis, left knee: Secondary | ICD-10-CM | POA: Diagnosis not present

## 2020-04-08 DIAGNOSIS — H34832 Tributary (branch) retinal vein occlusion, left eye, with macular edema: Secondary | ICD-10-CM | POA: Diagnosis not present

## 2020-04-08 DIAGNOSIS — H43391 Other vitreous opacities, right eye: Secondary | ICD-10-CM | POA: Diagnosis not present

## 2020-04-08 DIAGNOSIS — H43812 Vitreous degeneration, left eye: Secondary | ICD-10-CM | POA: Diagnosis not present

## 2020-04-08 DIAGNOSIS — H2513 Age-related nuclear cataract, bilateral: Secondary | ICD-10-CM | POA: Diagnosis not present

## 2020-06-24 DIAGNOSIS — H34832 Tributary (branch) retinal vein occlusion, left eye, with macular edema: Secondary | ICD-10-CM | POA: Diagnosis not present

## 2020-07-06 ENCOUNTER — Other Ambulatory Visit: Payer: Self-pay | Admitting: Cardiology

## 2020-09-21 DIAGNOSIS — H43391 Other vitreous opacities, right eye: Secondary | ICD-10-CM | POA: Diagnosis not present

## 2020-09-21 DIAGNOSIS — H34832 Tributary (branch) retinal vein occlusion, left eye, with macular edema: Secondary | ICD-10-CM | POA: Diagnosis not present

## 2020-09-21 DIAGNOSIS — H43812 Vitreous degeneration, left eye: Secondary | ICD-10-CM | POA: Diagnosis not present

## 2020-09-21 DIAGNOSIS — H2513 Age-related nuclear cataract, bilateral: Secondary | ICD-10-CM | POA: Diagnosis not present

## 2020-09-24 DIAGNOSIS — Z23 Encounter for immunization: Secondary | ICD-10-CM | POA: Diagnosis not present

## 2020-09-24 DIAGNOSIS — I10 Essential (primary) hypertension: Secondary | ICD-10-CM | POA: Diagnosis not present

## 2020-09-24 DIAGNOSIS — E785 Hyperlipidemia, unspecified: Secondary | ICD-10-CM | POA: Diagnosis not present

## 2020-09-24 DIAGNOSIS — Z808 Family history of malignant neoplasm of other organs or systems: Secondary | ICD-10-CM | POA: Diagnosis not present

## 2020-09-24 DIAGNOSIS — Z1211 Encounter for screening for malignant neoplasm of colon: Secondary | ICD-10-CM | POA: Diagnosis not present

## 2020-10-25 IMAGING — MG DIGITAL SCREENING BILAT W/ TOMO W/ CAD
6 of 10 series · 6 of 30 positions shown · non-contrast
Comparison: None.

CLINICAL DATA: Screening.

EXAM:
DIGITAL SCREENING BILATERAL MAMMOGRAM WITH TOMO AND CAD

[L CC synth-2D]
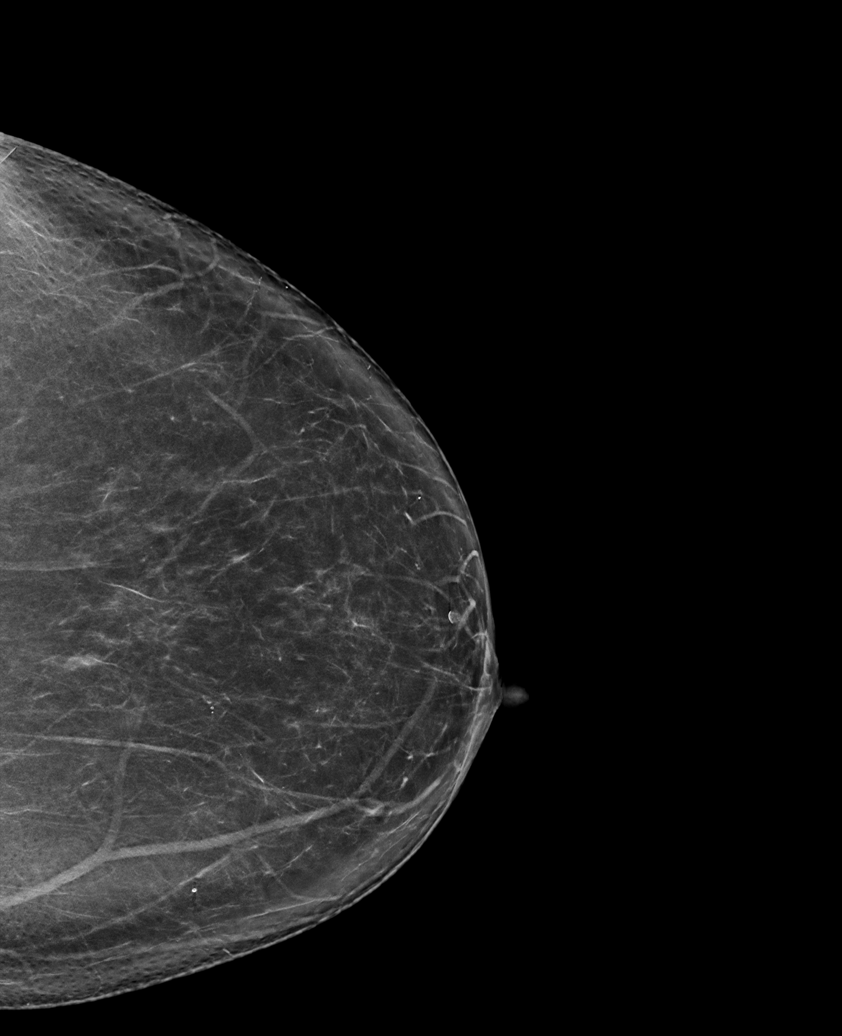

[R CC synth-2D]
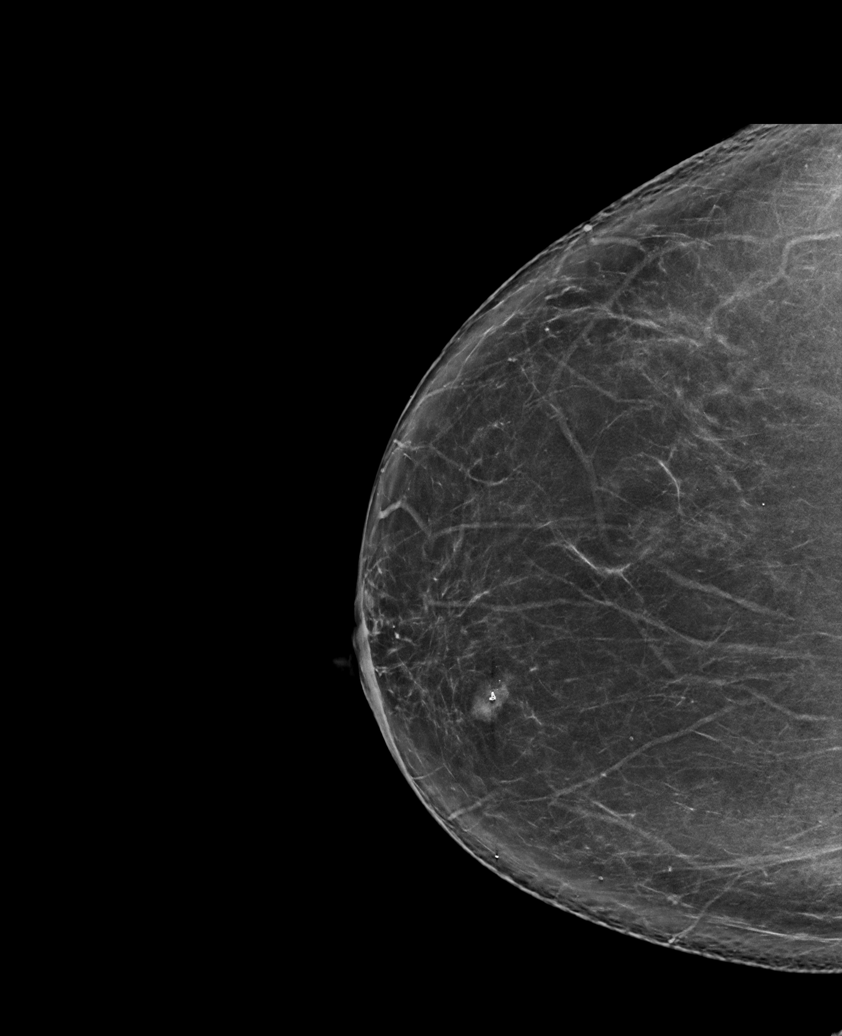

[L MLO synth-2D (1 of 2)]
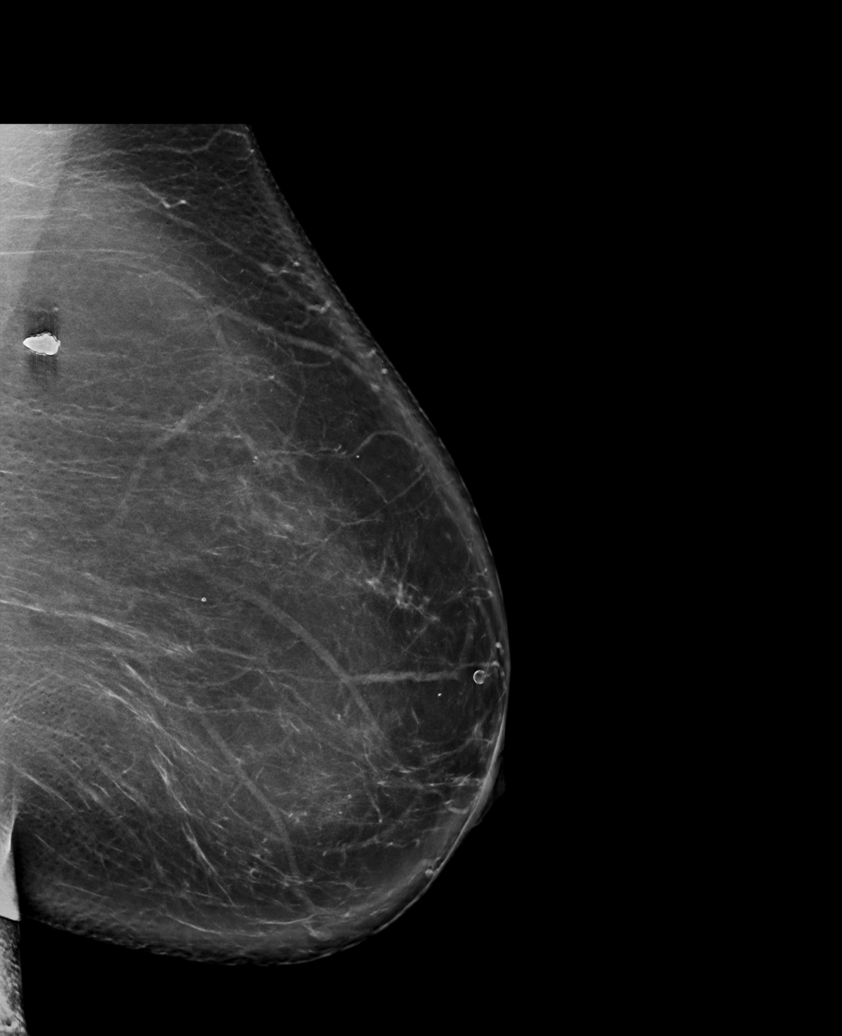

[L MLO synth-2D (2 of 2)]
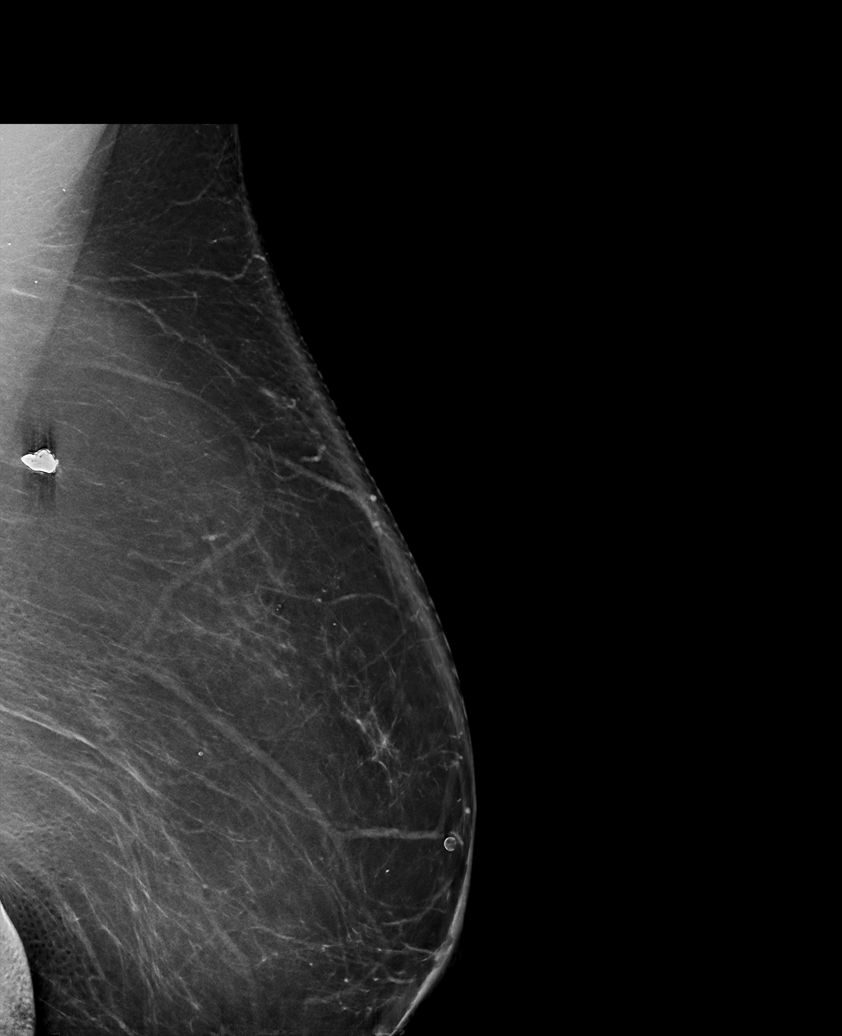

[R MLO synth-2D]
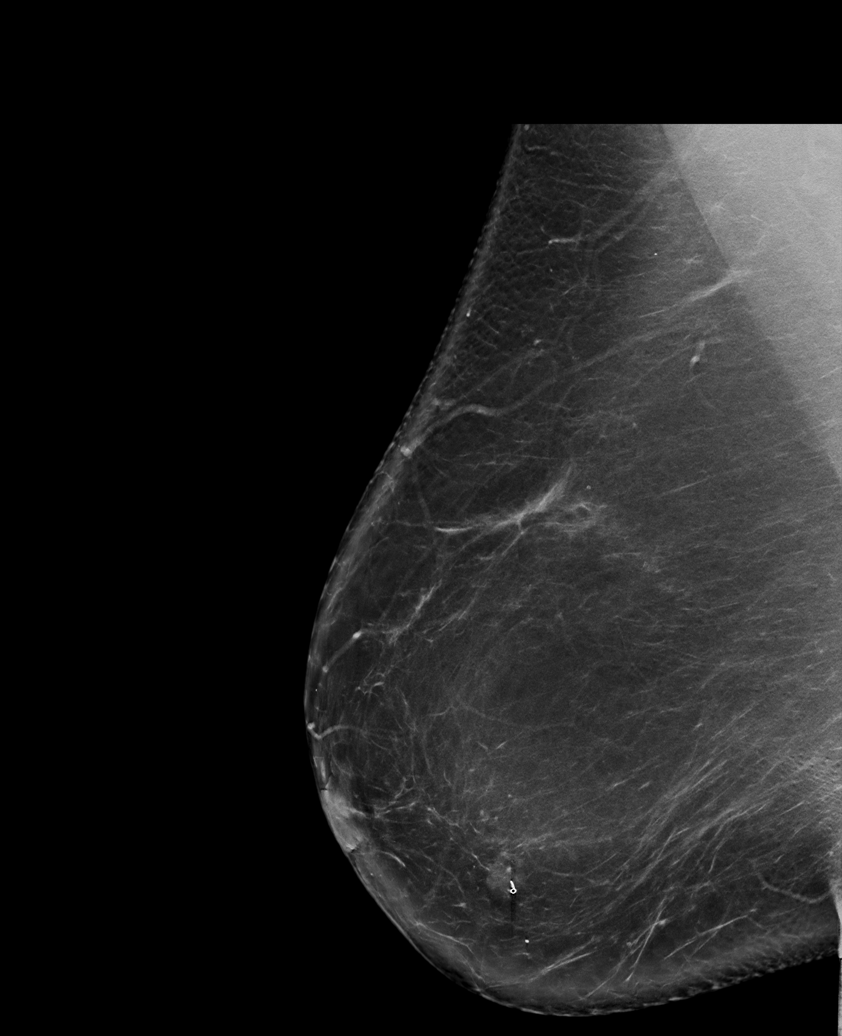

[L MLO tomo · tomo slice 52/103.0]
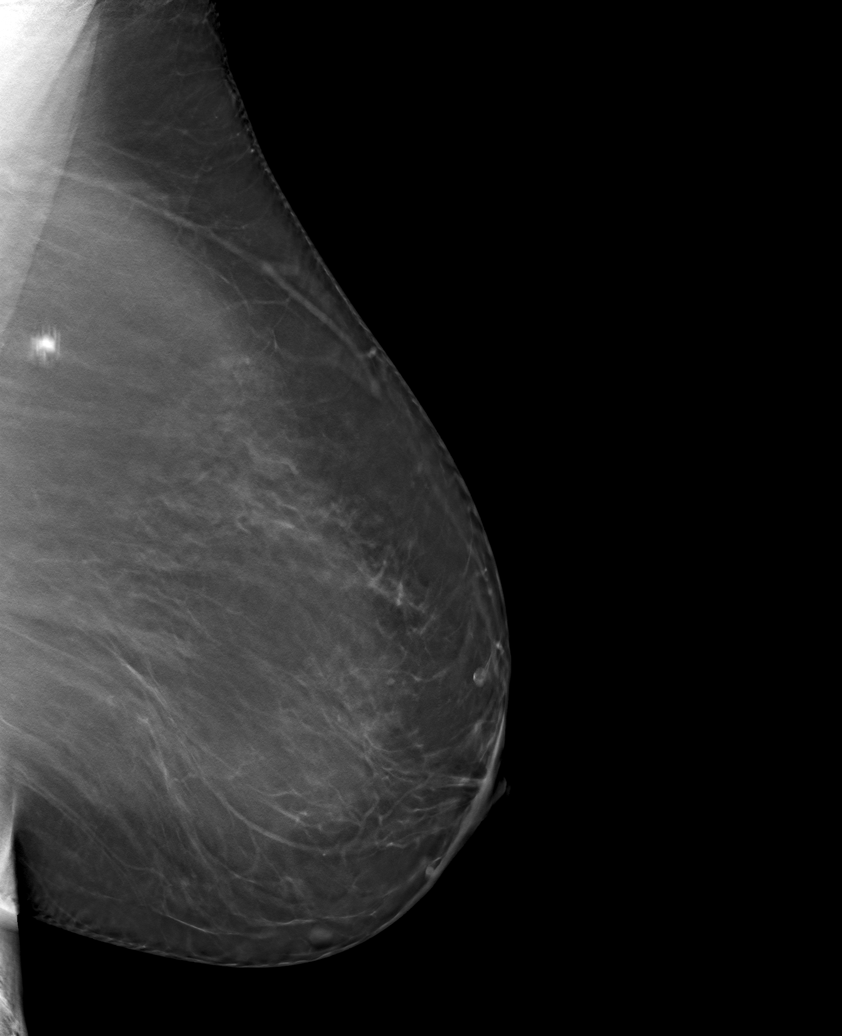

[6 of 30 positions shown; findings below may reference images not displayed]

ACR Breast Density Category b: There are scattered areas of
fibroglandular density.
FINDINGS: There are no findings suspicious for malignancy. Images were
processed with CAD.
IMPRESSION: No mammographic evidence of malignancy. A result letter of this
screening mammogram will be mailed directly to the patient.

RECOMMENDATION:
Screening mammogram in one year. (Code:Y5-G-EJ6)

BI-RADS CATEGORY  1: Negative.

## 2020-10-28 DIAGNOSIS — Z20822 Contact with and (suspected) exposure to covid-19: Secondary | ICD-10-CM | POA: Diagnosis not present

## 2020-12-02 DIAGNOSIS — H43812 Vitreous degeneration, left eye: Secondary | ICD-10-CM | POA: Diagnosis not present

## 2020-12-02 DIAGNOSIS — H34832 Tributary (branch) retinal vein occlusion, left eye, with macular edema: Secondary | ICD-10-CM | POA: Diagnosis not present

## 2020-12-02 DIAGNOSIS — H43391 Other vitreous opacities, right eye: Secondary | ICD-10-CM | POA: Diagnosis not present

## 2020-12-24 ENCOUNTER — Other Ambulatory Visit: Payer: Self-pay | Admitting: Cardiology

## 2021-02-10 DIAGNOSIS — H2513 Age-related nuclear cataract, bilateral: Secondary | ICD-10-CM | POA: Diagnosis not present

## 2021-02-10 DIAGNOSIS — H34832 Tributary (branch) retinal vein occlusion, left eye, with macular edema: Secondary | ICD-10-CM | POA: Diagnosis not present

## 2021-02-10 DIAGNOSIS — H43812 Vitreous degeneration, left eye: Secondary | ICD-10-CM | POA: Diagnosis not present

## 2021-02-10 DIAGNOSIS — H43391 Other vitreous opacities, right eye: Secondary | ICD-10-CM | POA: Diagnosis not present

## 2021-04-19 DIAGNOSIS — W57XXXA Bitten or stung by nonvenomous insect and other nonvenomous arthropods, initial encounter: Secondary | ICD-10-CM | POA: Diagnosis not present

## 2021-04-19 DIAGNOSIS — H43812 Vitreous degeneration, left eye: Secondary | ICD-10-CM | POA: Diagnosis not present

## 2021-04-19 DIAGNOSIS — S50862A Insect bite (nonvenomous) of left forearm, initial encounter: Secondary | ICD-10-CM | POA: Diagnosis not present

## 2021-04-19 DIAGNOSIS — H18519 Endothelial corneal dystrophy, unspecified eye: Secondary | ICD-10-CM | POA: Diagnosis not present

## 2021-04-19 DIAGNOSIS — H34832 Tributary (branch) retinal vein occlusion, left eye, with macular edema: Secondary | ICD-10-CM | POA: Diagnosis not present

## 2021-04-20 DIAGNOSIS — R21 Rash and other nonspecific skin eruption: Secondary | ICD-10-CM | POA: Diagnosis not present

## 2021-04-20 DIAGNOSIS — L299 Pruritus, unspecified: Secondary | ICD-10-CM | POA: Diagnosis not present

## 2021-05-22 DIAGNOSIS — J069 Acute upper respiratory infection, unspecified: Secondary | ICD-10-CM | POA: Diagnosis not present

## 2021-05-22 DIAGNOSIS — Z20822 Contact with and (suspected) exposure to covid-19: Secondary | ICD-10-CM | POA: Diagnosis not present

## 2021-05-26 DIAGNOSIS — R0981 Nasal congestion: Secondary | ICD-10-CM | POA: Diagnosis not present

## 2021-05-26 DIAGNOSIS — J069 Acute upper respiratory infection, unspecified: Secondary | ICD-10-CM | POA: Diagnosis not present

## 2021-05-26 DIAGNOSIS — H109 Unspecified conjunctivitis: Secondary | ICD-10-CM | POA: Diagnosis not present

## 2021-05-26 DIAGNOSIS — J029 Acute pharyngitis, unspecified: Secondary | ICD-10-CM | POA: Diagnosis not present

## 2021-05-26 DIAGNOSIS — Z20822 Contact with and (suspected) exposure to covid-19: Secondary | ICD-10-CM | POA: Diagnosis not present

## 2021-05-26 DIAGNOSIS — R059 Cough, unspecified: Secondary | ICD-10-CM | POA: Diagnosis not present

## 2021-06-03 DIAGNOSIS — I10 Essential (primary) hypertension: Secondary | ICD-10-CM | POA: Diagnosis not present

## 2021-06-03 DIAGNOSIS — J069 Acute upper respiratory infection, unspecified: Secondary | ICD-10-CM | POA: Diagnosis not present

## 2021-06-28 ENCOUNTER — Other Ambulatory Visit: Payer: Self-pay | Admitting: Cardiology

## 2021-07-14 DIAGNOSIS — H18513 Endothelial corneal dystrophy, bilateral: Secondary | ICD-10-CM | POA: Diagnosis not present

## 2021-07-14 DIAGNOSIS — H43391 Other vitreous opacities, right eye: Secondary | ICD-10-CM | POA: Diagnosis not present

## 2021-07-14 DIAGNOSIS — H34832 Tributary (branch) retinal vein occlusion, left eye, with macular edema: Secondary | ICD-10-CM | POA: Diagnosis not present

## 2021-07-14 DIAGNOSIS — H43812 Vitreous degeneration, left eye: Secondary | ICD-10-CM | POA: Diagnosis not present

## 2021-07-14 DIAGNOSIS — H2513 Age-related nuclear cataract, bilateral: Secondary | ICD-10-CM | POA: Diagnosis not present

## 2021-08-08 DIAGNOSIS — N949 Unspecified condition associated with female genital organs and menstrual cycle: Secondary | ICD-10-CM | POA: Diagnosis not present

## 2021-08-08 DIAGNOSIS — N760 Acute vaginitis: Secondary | ICD-10-CM | POA: Diagnosis not present

## 2021-08-08 DIAGNOSIS — N390 Urinary tract infection, site not specified: Secondary | ICD-10-CM | POA: Diagnosis not present

## 2021-09-22 DIAGNOSIS — H34832 Tributary (branch) retinal vein occlusion, left eye, with macular edema: Secondary | ICD-10-CM | POA: Diagnosis not present

## 2021-12-01 DIAGNOSIS — H43812 Vitreous degeneration, left eye: Secondary | ICD-10-CM | POA: Insufficient documentation

## 2021-12-01 DIAGNOSIS — H34832 Tributary (branch) retinal vein occlusion, left eye, with macular edema: Secondary | ICD-10-CM | POA: Diagnosis not present

## 2021-12-01 DIAGNOSIS — H348392 Tributary (branch) retinal vein occlusion, unspecified eye, stable: Secondary | ICD-10-CM | POA: Insufficient documentation

## 2021-12-01 DIAGNOSIS — H43391 Other vitreous opacities, right eye: Secondary | ICD-10-CM | POA: Diagnosis not present

## 2021-12-01 DIAGNOSIS — H43393 Other vitreous opacities, bilateral: Secondary | ICD-10-CM | POA: Insufficient documentation

## 2021-12-01 DIAGNOSIS — H18513 Endothelial corneal dystrophy, bilateral: Secondary | ICD-10-CM | POA: Diagnosis not present

## 2021-12-01 DIAGNOSIS — H18519 Endothelial corneal dystrophy, unspecified eye: Secondary | ICD-10-CM | POA: Insufficient documentation

## 2022-02-16 DIAGNOSIS — H34832 Tributary (branch) retinal vein occlusion, left eye, with macular edema: Secondary | ICD-10-CM | POA: Diagnosis not present

## 2022-02-16 DIAGNOSIS — H43812 Vitreous degeneration, left eye: Secondary | ICD-10-CM | POA: Diagnosis not present

## 2022-02-16 DIAGNOSIS — H18513 Endothelial corneal dystrophy, bilateral: Secondary | ICD-10-CM | POA: Diagnosis not present

## 2022-03-31 DIAGNOSIS — E785 Hyperlipidemia, unspecified: Secondary | ICD-10-CM | POA: Diagnosis not present

## 2022-03-31 DIAGNOSIS — E669 Obesity, unspecified: Secondary | ICD-10-CM | POA: Diagnosis not present

## 2022-03-31 DIAGNOSIS — I10 Essential (primary) hypertension: Secondary | ICD-10-CM | POA: Diagnosis not present

## 2022-03-31 DIAGNOSIS — Z23 Encounter for immunization: Secondary | ICD-10-CM | POA: Diagnosis not present

## 2022-03-31 DIAGNOSIS — Z6834 Body mass index (BMI) 34.0-34.9, adult: Secondary | ICD-10-CM | POA: Diagnosis not present

## 2022-04-04 ENCOUNTER — Other Ambulatory Visit: Payer: Self-pay | Admitting: Family Medicine

## 2022-04-04 DIAGNOSIS — Z8249 Family history of ischemic heart disease and other diseases of the circulatory system: Secondary | ICD-10-CM

## 2022-04-17 ENCOUNTER — Emergency Department (HOSPITAL_BASED_OUTPATIENT_CLINIC_OR_DEPARTMENT_OTHER): Payer: Federal, State, Local not specified - PPO | Admitting: Radiology

## 2022-04-17 ENCOUNTER — Other Ambulatory Visit: Payer: Self-pay

## 2022-04-17 ENCOUNTER — Encounter (HOSPITAL_BASED_OUTPATIENT_CLINIC_OR_DEPARTMENT_OTHER): Payer: Self-pay

## 2022-04-17 ENCOUNTER — Emergency Department (HOSPITAL_BASED_OUTPATIENT_CLINIC_OR_DEPARTMENT_OTHER)
Admission: EM | Admit: 2022-04-17 | Discharge: 2022-04-17 | Disposition: A | Discharge status: Home / Self Care | Payer: Federal, State, Local not specified - PPO | Attending: Emergency Medicine | Admitting: Emergency Medicine

## 2022-04-17 DIAGNOSIS — Z79899 Other long term (current) drug therapy: Secondary | ICD-10-CM | POA: Diagnosis not present

## 2022-04-17 DIAGNOSIS — R079 Chest pain, unspecified: Secondary | ICD-10-CM | POA: Diagnosis not present

## 2022-04-17 DIAGNOSIS — I10 Essential (primary) hypertension: Secondary | ICD-10-CM | POA: Diagnosis not present

## 2022-04-17 LAB — BASIC METABOLIC PANEL
Anion gap: 7 (ref 5–15)
BUN: 8 mg/dL (ref 8–23)
CO2: 28 mmol/L (ref 22–32)
Calcium: 10.2 mg/dL (ref 8.9–10.3)
Chloride: 104 mmol/L (ref 98–111)
Creatinine, Ser: 0.62 mg/dL (ref 0.44–1.00)
GFR, Estimated: >60 mL/min (ref >60)
Glucose, Bld: 111 mg/dL — ABNORMAL HIGH (ref 70–99)
Potassium: 4.5 mmol/L (ref 3.5–5.1)
Sodium: 139 mmol/L (ref 135–145)

## 2022-04-17 LAB — CBC
HCT: 38.3 % (ref 36.0–46.0)
Hemoglobin: 12.9 g/dL (ref 12.0–15.0)
MCH: 28.5 pg (ref 26.0–34.0)
MCHC: 33.7 g/dL (ref 30.0–36.0)
MCV: 84.7 fL (ref 80.0–100.0)
Platelets: 213 10*3/uL (ref 150–400)
RBC: 4.52 MIL/uL (ref 3.87–5.11)
RDW: 12.4 % (ref 11.5–15.5)
WBC: 5.6 10*3/uL (ref 4.0–10.5)
nRBC: 0 % (ref 0.0–0.2)

## 2022-04-17 LAB — TROPONIN I (HIGH SENSITIVITY): Troponin I (High Sensitivity): 3 ng/L (ref <18)

## 2022-04-17 NOTE — Discharge Instructions (Signed)
Start taking your lisinopril in the morning Continue taking your medications as prescribed--as you have been.  Follow-up with your primary care doctor  Return to the emergency for any new or concerning symptoms such as chest pain difficulty breathing or headache lightheadedness dizziness, etc.

## 2022-04-17 NOTE — ED Triage Notes (Signed)
Patient here POV from Home.  Patient endorses assessing BP at 0730 this AM and it was 155/92. Patient assessed Serial BP Readings and had similar Results. PCP advised ED Evaluation.   History of HTN and Patient has been taking Medications as Prescribed. Patient has No Symptoms besides "Hollow Feeling in Chest". Patient was taking BP per Normal.   NAD Noted during Triage. A&Ox4. GCS 15. Ambulatory.

## 2022-04-17 NOTE — ED Provider Notes (Signed)
MEDCENTER [facility] EMERGENCY DEPT Provider Note   CSN: [id number] Arrival date & time: 04/17/22  1111     History  Chief Complaint  Patient presents with   Hypertension    Autumn Flores is a 61 y.o. female.   Hypertension  Patient is a 61 year old female with past medical history significant for HTN and HLD.  She is present emergency room today with complaints of elevated blood pressure.  She states around 730 this morning she woke up and felt slightly under the weather she states that she felt somewhat congested and when she checked her blood pressure was mildly elevated at 150s/90s, she states that this made her more worried about her blood pressure and she rechecked it and it continued progressively read higher numbers.  She called her PCP office however her MD was out of office and the RN she talked to recommended ER visit.  She denies any chest pain difficulty breathing headache lightheadedness dizziness numbness weakness slurred speech confusion no lower extremity swelling     Home Medications Prior to Admission medications   Medication Sig Start Date End Date Taking? Authorizing Provider  labetalol (NORMODYNE) 100 MG tablet Take 1 tablet by mouth 2 (two) times daily. 11/14/18   [provider]  lisinopril (ZESTRIL) 20 MG tablet TAKE 1 TABLET BY MOUTH EVERY NIGHT 12/24/20   Patwardhan, Anabel Bene, MD  Nasal Moisturizer Combination (OCEAN COMPLETE SINUS RINSE NA) Place 1 spray into the nose as needed.    [provider]      Allergies    Sulfa antibiotics    Review of Systems   Review of Systems  Physical Exam Updated Vital Signs BP (!) 157/92   Pulse 73   Temp 98.2 F (36.8 C)   Resp 16   Ht 5\' 6"  (1.676 m)   Wt 100.2 kg   SpO2 98%   BMI 35.65 kg/m  Physical Exam Vitals and nursing note reviewed.  Constitutional:      General: She is not in acute distress. HENT:     Head: Normocephalic and atraumatic.     Nose: Nose normal.      Mouth/Throat:     Mouth: Mucous membranes are moist.  Eyes:     General: No scleral icterus. Cardiovascular:     Rate and Rhythm: Normal rate and regular rhythm.     Pulses: Normal pulses.     Heart sounds: Normal heart sounds.  Pulmonary:     Effort: Pulmonary effort is normal. No respiratory distress.     Breath sounds: No wheezing.     Comments: Lungs clear Abdominal:     Palpations: Abdomen is soft.     Tenderness: There is no abdominal tenderness. There is no guarding or rebound.  Musculoskeletal:     Cervical back: Normal range of motion.     Right lower leg: No edema.     Left lower leg: No edema.  Skin:    General: Skin is warm and dry.     Capillary Refill: Capillary refill takes less than 2 seconds.  Neurological:     Mental Status: She is alert. Mental status is at baseline.  Psychiatric:        Mood and Affect: Mood normal.        Behavior: Behavior normal.     ED Results / Procedures / Treatments   Labs (all labs ordered are listed, but only abnormal results are displayed) Labs Reviewed  BASIC METABOLIC PANEL - Abnormal; Notable for the  following components:      Result Value   Glucose, Bld 111 (*)    All other components within normal limits  CBC  TROPONIN I (HIGH SENSITIVITY)    EKG None  Radiology DG Chest 2 View  Result Date: 04/17/2022 CLINICAL DATA:  Provided history: Chest pain. Additional history provided: Hypertension. EXAM: CHEST - 2 VIEW COMPARISON:  Prior chest radiographs 01/06/2014. FINDINGS: Heart size within normal limits. Aortic atherosclerosis. No appreciable airspace consolidation or pulmonary edema. No evidence of pleural effusion or pneumothorax. No acute bony abnormality identified. Surgical clips within the upper abdomen. IMPRESSION: No evidence of acute cardiopulmonary abnormality. Aortic Atherosclerosis (ICD10-I70.0). Electronically Signed   By: Jackey Loge D.O.   On: 04/17/2022 11:59    Procedures Procedures    Medications  Ordered in ED Medications - No data to display  ED Course/ Medical Decision Making/ A&P                           Medical Decision Making Amount and/or Complexity of Data Reviewed Labs: ordered. Radiology: ordered.    Patient is a 61 year old female with past medical history significant for HTN and HLD.  She is present emergency room today with complaints of elevated blood pressure.  She states around 730 this morning she woke up and felt slightly under the weather she states that she felt somewhat congested and when she checked her blood pressure was mildly elevated at 150s/90s, she states that this made her more worried about her blood pressure and she rechecked it and it continued progressively read higher numbers.  She called her PCP office however her MD was out of office and the RN she talked to recommended ER visit.  She denies any chest pain difficulty breathing headache lightheadedness dizziness numbness weakness slurred speech confusion no lower extremity swelling  Patient states that she just got back from a vacation where she "ate junk food "also states that she has been under more stress recently because of some family matters.   Physical exam unremarkable  EKG without acute abnormality, normal sinus rhythm no evidence of ischemia.  Chest x-ray personally reviewed by me I do not appreciate any pneumonia, pulmonary edema or other acute abnormalities.  No pneumothorax either.  BMP with normal, CBC without leukocytosis or anemia troponin x1 within normal limits.  She is not having any chest pain or any other associate symptoms.  She is having asymptomatic elevated blood pressure.  Blood pressure has actually normalized here in the emergency room and she is been updated about her normal results.  She feels reassured.  Will discharge home with close follow-up with PCP.  Strict return precautions were given for any chest pain, headache, difficulty breathing or any other worrying  symptoms  Final Clinical Impression(s) / ED Diagnoses Final diagnoses:  Asymptomatic hypertension    Rx / DC Orders ED Discharge Orders     None         Gailen Shelter, [place] 04/17/22 1415    Derwood Kaplan, MD 04/17/22 1443

## 2022-04-20 DIAGNOSIS — H34832 Tributary (branch) retinal vein occlusion, left eye, with macular edema: Secondary | ICD-10-CM | POA: Diagnosis not present

## 2022-04-20 DIAGNOSIS — H18513 Endothelial corneal dystrophy, bilateral: Secondary | ICD-10-CM | POA: Diagnosis not present

## 2022-04-20 DIAGNOSIS — H43391 Other vitreous opacities, right eye: Secondary | ICD-10-CM | POA: Diagnosis not present

## 2022-04-20 DIAGNOSIS — H43812 Vitreous degeneration, left eye: Secondary | ICD-10-CM | POA: Diagnosis not present

## 2022-05-09 DIAGNOSIS — I10 Essential (primary) hypertension: Secondary | ICD-10-CM | POA: Diagnosis not present

## 2022-05-09 DIAGNOSIS — E785 Hyperlipidemia, unspecified: Secondary | ICD-10-CM | POA: Diagnosis not present

## 2022-05-09 DIAGNOSIS — R945 Abnormal results of liver function studies: Secondary | ICD-10-CM | POA: Diagnosis not present

## 2022-05-09 DIAGNOSIS — Z8249 Family history of ischemic heart disease and other diseases of the circulatory system: Secondary | ICD-10-CM | POA: Diagnosis not present

## 2022-05-16 ENCOUNTER — Other Ambulatory Visit: Payer: Self-pay | Admitting: Family Medicine

## 2022-05-16 DIAGNOSIS — R748 Abnormal levels of other serum enzymes: Secondary | ICD-10-CM

## 2022-05-26 ENCOUNTER — Ambulatory Visit
Admission: RE | Admit: 2022-05-26 | Discharge: 2022-05-26 | Disposition: A | Discharge status: Home / Self Care | Payer: Federal, State, Local not specified - PPO | Source: Ambulatory Visit | Attending: Family Medicine | Admitting: Family Medicine

## 2022-05-26 DIAGNOSIS — Z8249 Family history of ischemic heart disease and other diseases of the circulatory system: Secondary | ICD-10-CM

## 2022-05-26 DIAGNOSIS — I700 Atherosclerosis of aorta: Secondary | ICD-10-CM | POA: Diagnosis not present

## 2022-06-08 ENCOUNTER — Ambulatory Visit
Admission: RE | Admit: 2022-06-08 | Discharge: 2022-06-08 | Disposition: A | Discharge status: Home / Self Care | Payer: Federal, State, Local not specified - PPO | Source: Ambulatory Visit | Attending: Family Medicine | Admitting: Family Medicine

## 2022-06-08 DIAGNOSIS — R7989 Other specified abnormal findings of blood chemistry: Secondary | ICD-10-CM | POA: Diagnosis not present

## 2022-06-08 DIAGNOSIS — R748 Abnormal levels of other serum enzymes: Secondary | ICD-10-CM

## 2022-06-09 DIAGNOSIS — I359 Nonrheumatic aortic valve disorder, unspecified: Secondary | ICD-10-CM | POA: Diagnosis not present

## 2022-07-06 DIAGNOSIS — H34832 Tributary (branch) retinal vein occlusion, left eye, with macular edema: Secondary | ICD-10-CM | POA: Diagnosis not present

## 2022-07-13 DIAGNOSIS — I10 Essential (primary) hypertension: Secondary | ICD-10-CM | POA: Diagnosis not present

## 2022-07-13 DIAGNOSIS — R7309 Other abnormal glucose: Secondary | ICD-10-CM | POA: Diagnosis not present

## 2022-07-13 DIAGNOSIS — R945 Abnormal results of liver function studies: Secondary | ICD-10-CM | POA: Diagnosis not present

## 2022-07-13 DIAGNOSIS — L989 Disorder of the skin and subcutaneous tissue, unspecified: Secondary | ICD-10-CM | POA: Diagnosis not present

## 2022-07-18 ENCOUNTER — Ambulatory Visit (HOSPITAL_BASED_OUTPATIENT_CLINIC_OR_DEPARTMENT_OTHER): Payer: Federal, State, Local not specified - PPO | Admitting: Cardiology

## 2022-07-18 ENCOUNTER — Encounter (HOSPITAL_BASED_OUTPATIENT_CLINIC_OR_DEPARTMENT_OTHER): Payer: Self-pay | Admitting: Cardiology

## 2022-07-18 VITALS — BP 144/85 | HR 62 | Ht 66.5 in | Wt 211.1 lb

## 2022-07-18 DIAGNOSIS — E7800 Pure hypercholesterolemia, unspecified: Secondary | ICD-10-CM

## 2022-07-18 DIAGNOSIS — I2510 Atherosclerotic heart disease of native coronary artery without angina pectoris: Secondary | ICD-10-CM | POA: Diagnosis not present

## 2022-07-18 DIAGNOSIS — E6609 Other obesity due to excess calories: Secondary | ICD-10-CM | POA: Diagnosis not present

## 2022-07-18 DIAGNOSIS — E8881 Metabolic syndrome: Secondary | ICD-10-CM | POA: Diagnosis not present

## 2022-07-18 DIAGNOSIS — K760 Fatty (change of) liver, not elsewhere classified: Secondary | ICD-10-CM

## 2022-07-18 DIAGNOSIS — Z6833 Body mass index (BMI) 33.0-33.9, adult: Secondary | ICD-10-CM

## 2022-07-18 MED ORDER — ROSUVASTATIN CALCIUM 10 MG PO TABS: 10.0000 mg | ORAL_TABLET | Freq: Every day | ORAL | 3 refills | Status: DC

## 2022-07-18 MED ORDER — ASPIRIN 81 MG PO TBEC: 81.0000 mg | DELAYED_RELEASE_TABLET | Freq: Every day | ORAL | 3 refills | Status: AC

## 2022-07-18 NOTE — Progress Notes (Signed)
Cardiology Office Note:    Date:  07/18/2022   ID:  Autumn Flores, DOB [birth year], MRN [id number]  PCP:  Henrine Screws, MD   [facility] Providers Cardiologist:  Jodelle Red, MD     Referring MD: Henrine Screws, MD   CC: new patient consultation for abnormal calcium score and aortic calcification  History of Present Illness:    Autumn Flores is a 61 y.o. female with a hx of hypertension and hyperlipidemia here to establish care for calcification of an aortic valve.    She recently had a coronary calcium score that showed an overall score of 69.5 which is 85th percentile for her age, race and sex. Due to the score she had an echocardiogram. She is following up with these test due to her age and a family history of CAD.   Cardiovascular risk factors: Prior clinical ASCVD: none Comorbid conditions: hypertension, hyperlipidemia Metabolic syndrome/Obesity:BMI 19.50 Chronic inflammatory conditions: none Tobacco use history:  never Family history: Father's side of the family grandmother had history of CAD but was a heavy smoker. Her mother has chronic hypertension that has been difficult to control, also had a heart murmur. Her sister also had high blood pressure and did have to have CABG at 22 years of old.  Prior cardiac testing and/or incidental findings on other testing (ie coronary calcium): coronary calcium score 69.5 (05/26/2022) Current diet: She is trying to cut out carbs, diet had been heavy on carbs previously.  Today, she reports that she is doing well and is not having any symptoms but wanted to go over her echocardiogram. I have a copy of her report but cannot see the actual images. She also notes previous echo at [facility], which I also cannot see. We did review the actual images of her calcium score.  She denies any palpitations, chest pain, shortness of breath, or peripheral edema. No lightheadedness, headaches, syncope, orthopnea, or PND.    Past Medical History:  Diagnosis Date   Hyperlipidemia    Hypertension    Retinal vein occlusion of left eye     Past Surgical History:  Procedure Laterality Date   CHOLECYSTECTOMY  2003   MYOMECTOMY  2008    Current Medications: Current Meds  Medication Sig   aspirin EC 81 MG tablet Take 1 tablet (81 mg total) by mouth daily. Swallow whole.   labetalol (NORMODYNE) 100 MG tablet Take 1 tablet by mouth 2 (two) times daily.   lisinopril (ZESTRIL) 20 MG tablet TAKE 1 TABLET BY MOUTH EVERY NIGHT   Nasal Moisturizer Combination (OCEAN COMPLETE SINUS RINSE NA) Place 1 spray into the nose as needed.   rosuvastatin (CRESTOR) 10 MG tablet Take 1 tablet (10 mg total) by mouth daily.     Allergies:   Sulfa antibiotics   Social History   Tobacco Use   Smoking status: Never   Smokeless tobacco: Former  Building services engineer Use: Never used  Substance Use Topics   Alcohol use: Yes    Comment: occasional    Drug use: Never      Family History: The patient's family history includes Atrial fibrillation in her mother; Diabetes in her mother; Heart attack in her mother; Heart disease in her mother and sister; Hyperlipidemia in her mother; Hypertension in her mother and sister.  ROS:   Please see the history of present illness.  Additional pertinent ROS: Constitutional: Negative for chills, fever, night sweats, unintentional weight loss  HENT: Negative for ear pain and hearing  loss.   Eyes: Negative for loss of vision and eye pain.  Respiratory: Negative for cough, sputum, wheezing.   Cardiovascular: See HPI. Gastrointestinal: Negative for abdominal pain, melena, and hematochezia.  Genitourinary: Negative for dysuria and hematuria.  Musculoskeletal: Negative for falls and myalgias.  Skin: Negative for itching and rash.  Neurological: Negative for focal weakness, focal sensory changes and loss of consciousness.  Endo/Heme/Allergies: Does not bruise/bleed easily.     EKGs/Labs/Other Studies Reviewed:    The following studies were reviewed today: ECHO 06/11/2022:  Conclusions: Left ventricular cavity is normal in size. Normal global wall motion. No left ventricular wall motion abnormalities are seen. Calculated EF 53.69%. Left atrium size is normal. Right ventricle is normal in size. Trace mitral regurgitation. Trace tricuspid regurgitation. No pericardial effusion observed. IVC is normal with respiratory variation of >50% collapse. No significant valve abnormalities. No evidence of pulmonary hypertension. Valves not well visualized  CT Cardiac Scoring 05/26/2022: Findings: Coronary Calcium Scores:  Left Main: 0 LAD:61.9 LCX:0 RCA:7.6  Total Score: 69.5  IMPRESSIONS:  Coronary calcium score of 69.5 is at 85th percentile for the patient's age, sex and race.  Calcification associated with the aortic valve. Correlation with echocardiography may be helpful.  Soft tissue nodularity measuring roughly 16mm associated with focal calcification in the soft tissue of the right breast. Correlations with any prior mammography recommended.  EKG:  EKG is personally reviewed.  07/18/2022: not ordered today 04/17/2022: NSR at 75 bpm  Recent Labs: 04/17/2022: BUN 8; Creatinine, Ser 0.62; Hemoglobin 12.9; Platelets 213; Potassium 4.5; Sodium 139  Recent Lipid Panel No results found for: "CHOL", "TRIG", "HDL", "CHOLHDL", "VLDL", "LDLCALC", "LDLDIRECT"  Physical Exam:    VS:  BP (!) 144/85 (BP Location: Left Arm, Patient Position: Sitting, Cuff Size: Normal)   Pulse 62   Ht 5' 6.5" (1.689 m)   Wt 211 lb 1.6 oz (95.8 kg)   SpO2 97%   BMI 33.56 kg/m     Wt Readings from Last 3 Encounters:  07/18/22 211 lb 1.6 oz (95.8 kg)  04/17/22 220 lb 14.4 oz (100.2 kg)  07/18/19 221 lb (100.2 kg)     GEN: Well nourished, well developed in no acute distress HEENT: Normal, moist mucous membranes NECK: No JVD CARDIAC: regular rhythm, normal S1 and S2, no rubs or gallops.  No murmurs. VASCULAR: Radial and DP pulses 2+ bilaterally. No carotid bruits RESPIRATORY:  Clear to auscultation without rales, wheezing or rhonchi  ABDOMEN: Soft, non-tender, non-distended MUSCULOSKELETAL:  Ambulates independently SKIN: Warm and dry, no edema NEUROLOGIC:  Alert and oriented x 3. No focal neuro deficits noted. PSYCHIATRIC:  Normal affect    .  ASSESSMENT:    1. Nonocclusive coronary atherosclerosis of native coronary artery   2. Metabolic syndrome   3. Hepatic steatosis   4. Class 1 obesity due to excess calories with serious comorbidity and body mass index (BMI) of 33.0 to 33.9 in adult   5. Pure hypercholesterolemia    PLAN:    Coronary artery calcification consistent with nonobstructive CAD -We reviewed the calcium score at length, including actual images as well as the graph showing mortality based on calcium score. We discussed the pathophysiology of cholesterol plaque formation, the role of calcium and why it is a marker, how plaque is key to acute MI/CVA, and how known plaque is managed with medications.   -she was on atorvastatin remotely, but this caused her sugar to be elevated. She was on pravastatin for a time but had mildly  elevated LFTs. U/S showed hepatic steatosis. She recently had her LFTs rechecked and is waiting for a determination from her PCP if she is safe to restart statin. I can see an ALT of 58, but I cannot see her AST or her prior numbers. I would recommend that when she is cleared for statin, she start 10 mg rosuvastatin instead of pravastatin. LDL goal <70. Recheck lipids/LFTs 2 mos after starting rosuvastatin  Metabolic syndrome: with obesity, hypercholesterolemia, hepatic steatosis, coronary artery disease. History of elevated sugars in the past without frank diabetes -we discussed recommendations for management of this. She is working hard on lifestyle and weight loss -we discussed [place] at length. This would be a good fit for her. She  would like to do some research and will contact me if she is interested in pursuing.  Follow Up: 1 year, or sooner if she wishes to discuss GLP1RA  Buford Dresser, MD, PhD, [facility] [address] [place], [place] [address] [phone]   Medication Adjustments/Labs and Tests Ordered: Current medicines are reviewed at length with the patient today.  Concerns regarding medicines are outlined above.  Orders Placed This Encounter  Procedures   Lipoprotein A (LPA)   Lipid panel   Hepatic function panel   Meds ordered this encounter  Medications   aspirin EC 81 MG tablet    Sig: Take 1 tablet (81 mg total) by mouth daily. Swallow whole.    Dispense:  90 tablet    Refill:  3   rosuvastatin (CRESTOR) 10 MG tablet    Sig: Take 1 tablet (10 mg total) by mouth daily.    Dispense:  90 tablet    Refill:  3    Patient Instructions  Medication Instructions:  We discussed semaglutide [facility] for non-diabetics, Ozempic for diabetics) as an option for weight loss help. The other options include dulaglutide (Trulicity) and tirzepazetide Darcel Bayley).  We are starting aspirin 81 mg given your coronary calcium. I cannot see all of your labs, but if your liver function tests are improved, I would start 10 mg rosuvastatin instead of pravastatin. I would recheck your cholesterol and LFTs in 2 mos once you start rosuvastatin.  *If you need a refill on your cardiac medications before your next appointment, please call your pharmacy*  Lab Work: Your physician recommends that you return for lab work today- Lpa  Please return for Lab work in 2 months for fasting Lipid panel and liver function tests. You may come to the...   [facility] (3rd floor) [address] [place], [place] [address]  Open: 8am-Noon and 1pm-4:30pm  Please ring the doorbell on the small table when  you exit the elevator and the Lab Tech will come get you  [place] at [facility] [address] [place], [place], [place] [address] Open: 8am-1pm, then 2pm-4:30pm   [place]- Please see attached locations sheet stapled to your lab work with address and hours.   If you have labs (blood work) drawn today and your tests are completely normal, you will receive your results only by: [place] (if you have MyChart) OR A paper copy in the mail If you have any lab test that is abnormal or we need to change your treatment, we will call you to review the results.  Follow-Up: At [facility], you and your health needs are our priority.  As part of our continuing mission to provide you with exceptional heart care, we have created designated Provider Care Teams.  These Care Teams include your primary Cardiologist (physician) and Advanced Practice Providers (APPs -  Physician Assistants and Nurse Practitioners) who all work together to provide you with the care you need, when you need it.  We recommend signing up for the patient portal called "MyChart".  Sign up information is provided on this After Visit Summary.  MyChart is used to connect with patients for Virtual Visits (Telemedicine).  Patients are able to view lab/test results, encounter notes, upcoming appointments, etc.  Non-urgent messages can be sent to your provider as well.   To learn more about what you can do with MyChart, go to [email or web address].    Your next appointment:   1 year(s)  The format for your next appointment:   In Person  Provider:   Jodelle Red, MD           Jodelle [place] Ford,acting as a scribe for Jodelle Red, MD.,have documented all relevant documentation on the behalf of Jodelle Red, MD,as directed by  Jodelle Red, MD while in the presence of Jodelle Red, MD.   I, Jodelle Red, MD, have reviewed all  documentation for this visit. The documentation on 07/18/22 for the exam, diagnosis, procedures, and orders are all accurate and complete.   Signed, Jodelle Red, MD  07/18/2022 1:30 PM    [facility]

## 2022-07-18 NOTE — Patient Instructions (Addendum)
Medication Instructions:  We discussed semaglutide [facility] for non-diabetics, Ozempic for diabetics) as an option for weight loss help. The other options include dulaglutide (Trulicity) and tirzepazetide Darcel Bayley).  We are starting aspirin 81 mg given your coronary calcium. I cannot see all of your labs, but if your liver function tests are improved, I would start 10 mg rosuvastatin instead of pravastatin. I would recheck your cholesterol and LFTs in 2 mos once you start rosuvastatin.  *If you need a refill on your cardiac medications before your next appointment, please call your pharmacy*  Lab Work: Your physician recommends that you return for lab work today- Lpa  Please return for Lab work in 2 months for fasting Lipid panel and liver function tests. You may come to the...   [facility] (3rd floor) [address] [place], [place] [address]  Open: 8am-Noon and 1pm-4:30pm  Please ring the doorbell on the small table when you exit the elevator and the Lab Tech will come get you  [place] at [facility] [address] [place], [place], [place] [address] Open: 8am-1pm, then 2pm-4:30pm   [place]- Please see attached locations sheet stapled to your lab work with address and hours.   If you have labs (blood work) drawn today and your tests are completely normal, you will receive your results only by: [place] (if you have MyChart) OR A paper copy in the mail If you have any lab test that is abnormal or we need to change your treatment, we will call you to review the results.  Follow-Up: At [facility], you and your health needs are our priority.  As part of our continuing mission to provide you with exceptional heart care, we have created designated Provider Care Teams.  These Care Teams include your primary Cardiologist (physician) and Advanced Practice Providers (APPs -  Physician Assistants and Nurse Practitioners) who all work  together to provide you with the care you need, when you need it.  We recommend signing up for the patient portal called "MyChart".  Sign up information is provided on this After Visit Summary.  MyChart is used to connect with patients for Virtual Visits (Telemedicine).  Patients are able to view lab/test results, encounter notes, upcoming appointments, etc.  Non-urgent messages can be sent to your provider as well.   To learn more about what you can do with MyChart, go to [email or web address].    Your next appointment:   1 year(s)  The format for your next appointment:   In Person  Provider:   Buford Dresser, MD

## 2022-07-19 LAB — LIPOPROTEIN A (LPA): Lipoprotein (a): 71 nmol/L (ref <75.0)

## 2022-08-07 DIAGNOSIS — Z1211 Encounter for screening for malignant neoplasm of colon: Secondary | ICD-10-CM | POA: Diagnosis not present

## 2022-08-11 ENCOUNTER — Encounter (HOSPITAL_BASED_OUTPATIENT_CLINIC_OR_DEPARTMENT_OTHER): Payer: Self-pay

## 2022-08-11 DIAGNOSIS — E7800 Pure hypercholesterolemia, unspecified: Secondary | ICD-10-CM

## 2022-09-14 DIAGNOSIS — H43812 Vitreous degeneration, left eye: Secondary | ICD-10-CM | POA: Diagnosis not present

## 2022-09-14 DIAGNOSIS — H18513 Endothelial corneal dystrophy, bilateral: Secondary | ICD-10-CM | POA: Diagnosis not present

## 2022-09-14 DIAGNOSIS — H34832 Tributary (branch) retinal vein occlusion, left eye, with macular edema: Secondary | ICD-10-CM | POA: Diagnosis not present

## 2022-09-14 DIAGNOSIS — H269 Unspecified cataract: Secondary | ICD-10-CM | POA: Diagnosis not present

## 2022-10-03 DIAGNOSIS — N760 Acute vaginitis: Secondary | ICD-10-CM | POA: Diagnosis not present

## 2022-10-17 ENCOUNTER — Ambulatory Visit: Payer: Federal, State, Local not specified - PPO | Attending: Internal Medicine | Admitting: Pharmacist

## 2022-10-17 ENCOUNTER — Encounter: Payer: Self-pay | Admitting: Pharmacist

## 2022-10-17 ENCOUNTER — Other Ambulatory Visit: Payer: Self-pay

## 2022-10-17 DIAGNOSIS — E8881 Metabolic syndrome: Secondary | ICD-10-CM | POA: Diagnosis not present

## 2022-10-17 DIAGNOSIS — R931 Abnormal findings on diagnostic imaging of heart and coronary circulation: Secondary | ICD-10-CM

## 2022-10-17 DIAGNOSIS — E7800 Pure hypercholesterolemia, unspecified: Secondary | ICD-10-CM | POA: Diagnosis not present

## 2022-10-17 DIAGNOSIS — I2510 Atherosclerotic heart disease of native coronary artery without angina pectoris: Secondary | ICD-10-CM | POA: Diagnosis not present

## 2022-10-17 DIAGNOSIS — K760 Fatty (change of) liver, not elsewhere classified: Secondary | ICD-10-CM | POA: Diagnosis not present

## 2022-10-17 LAB — HEPATIC FUNCTION PANEL
ALT: 49 IU/L — ABNORMAL HIGH (ref 0–32)
AST: 27 IU/L (ref 0–40)
Albumin: 4.3 g/dL (ref 3.9–4.9)
Alkaline Phosphatase: 66 IU/L (ref 44–121)
Bilirubin Total: 0.3 mg/dL (ref 0.0–1.2)
Bilirubin, Direct: 0.12 mg/dL (ref 0.00–0.40)
Total Protein: 6.7 g/dL (ref 6.0–8.5)

## 2022-10-17 LAB — LIPID PANEL
Chol/HDL Ratio: 4 ratio (ref 0.0–4.4)
Cholesterol, Total: 214 mg/dL — ABNORMAL HIGH (ref 100–199)
HDL: 53 mg/dL (ref >39)
LDL Chol Calc (NIH): 137 mg/dL — ABNORMAL HIGH (ref 0–99)
Triglycerides: 136 mg/dL (ref 0–149)
VLDL Cholesterol Cal: 24 mg/dL (ref 5–40)

## 2022-10-17 NOTE — Progress Notes (Signed)
Patient ID: Agam Tuohy                 DOB: 01-20-1961                    MRN: [id number]     HPI: Autumn Flores is a 62 y.o. female patient referred to lipid clinic by Dr Harrell Gave. PMH is significant for HTN, CAD, and elevated coronary calcium score. Recently started on rosuvastatin but discontnued due to myalgias.  Patient presents today in good spirits. Reports she was previously on pravastatin 20mg  nightly without adverse effects. Discontinued in October when switched to rosuvastatin. Took 2 weeks of rosuvastatin but discontinued due to myalgias. Has not been on any lipid lowering therapy since.  Has a history of elevated LFTs without discernable cause. Strong family history of CAD. Mother deceased from MI. Sister currently treated with statins and Repatha.  Current Medications:  N/A  Intolerances:  Rosuvastatin  Risk Factors:  CAD Family history Coronary calcium score  LDL goal: <70  Labs: TC 192, HDL 55, LDL 120, Trigs 94 (05/09/22 on pravastatin 20)  1. Coronary calcium score of 69.5 is at the 85th percentile for the patient's age, sex and race. 2. Calcifications associated with the aortic valve. Correlation with echocardiography may be helpful. 3. Soft tissue nodularity measuring roughly 9 mm associated with focal calcification in the soft tissues of the right breast. Correlation with any prior mammography recommended.   Past Medical History:  Diagnosis Date   Hyperlipidemia    Hypertension    Retinal vein occlusion of left eye     Current Outpatient Medications on File Prior to Visit  Medication Sig Dispense Refill   hydrOXYzine (ATARAX) 25 MG tablet 1 tablet     aspirin EC 81 MG tablet Take 1 tablet (81 mg total) by mouth daily. Swallow whole. 90 tablet 3   labetalol (NORMODYNE) 100 MG tablet Take 1 tablet by mouth 2 (two) times daily.     lisinopril (ZESTRIL) 20 MG tablet TAKE 1 TABLET BY MOUTH EVERY NIGHT 90 tablet 1   Nasal Moisturizer Combination  (OCEAN COMPLETE SINUS RINSE NA) Place 1 spray into the nose as needed.     pravastatin (PRAVACHOL) 20 MG tablet Take 20 mg by mouth daily.     rosuvastatin (CRESTOR) 10 MG tablet Take 1 tablet (10 mg total) by mouth daily. 90 tablet 3   No current facility-administered medications on file prior to visit.    Allergies  Allergen Reactions   Sulfa Antibiotics Rash    Assessment/Plan:  1. Hyperlipidemia - Patient's last LDL 120 was above goal of <70 however this was last July and patient has not been on any lipid lowering therapy in the last 2-3 months. Patient requests to restart pravastatin 20mg  sice she was tolerating well.  Will have new baseline lipid panel and LFTs performed today and then recheck again in 2-3 months for pravastatin increase or addition of PCSK9i.   Continue pravastatin 20mg  daily Check lipid panel Check LFTS  Karren Cobble, PharmD, BCACP, [place], [place], [place], [place], [address] Phone: [phone], Fax: [phone]

## 2022-10-17 NOTE — Patient Instructions (Addendum)
It was nice meeting you today  We would like your LDL (bad cholesterol) to be less than 70  Please restart your pravastatin 20mg  once a day in the evening  We will recheck your lipid panel and liver function tests today to get a new baseline  We can then recheck your cholesterol 3 months from now and make a new plan  Please let us know if you have any questions  Karren Cobble, PharmD, [place], [place], [place], [place], [place], [address] Phone: [phone], Fax: [phone]

## 2022-10-20 ENCOUNTER — Encounter (HOSPITAL_BASED_OUTPATIENT_CLINIC_OR_DEPARTMENT_OTHER): Payer: Self-pay

## 2022-12-01 ENCOUNTER — Other Ambulatory Visit (HOSPITAL_BASED_OUTPATIENT_CLINIC_OR_DEPARTMENT_OTHER): Payer: Self-pay | Admitting: Family Medicine

## 2022-12-01 DIAGNOSIS — Z1231 Encounter for screening mammogram for malignant neoplasm of breast: Secondary | ICD-10-CM

## 2022-12-01 DIAGNOSIS — J0140 Acute pansinusitis, unspecified: Secondary | ICD-10-CM | POA: Diagnosis not present

## 2022-12-05 DIAGNOSIS — H43391 Other vitreous opacities, right eye: Secondary | ICD-10-CM | POA: Diagnosis not present

## 2022-12-05 DIAGNOSIS — H43812 Vitreous degeneration, left eye: Secondary | ICD-10-CM | POA: Diagnosis not present

## 2022-12-05 DIAGNOSIS — H18513 Endothelial corneal dystrophy, bilateral: Secondary | ICD-10-CM | POA: Diagnosis not present

## 2022-12-05 DIAGNOSIS — H34832 Tributary (branch) retinal vein occlusion, left eye, with macular edema: Secondary | ICD-10-CM | POA: Diagnosis not present

## 2022-12-07 ENCOUNTER — Ambulatory Visit (HOSPITAL_BASED_OUTPATIENT_CLINIC_OR_DEPARTMENT_OTHER)
Admission: RE | Admit: 2022-12-07 | Discharge: 2022-12-07 | Disposition: A | Discharge status: Home / Self Care | Payer: Federal, State, Local not specified - PPO | Source: Ambulatory Visit | Attending: Family Medicine | Admitting: Family Medicine

## 2022-12-07 DIAGNOSIS — Z1231 Encounter for screening mammogram for malignant neoplasm of breast: Secondary | ICD-10-CM | POA: Diagnosis not present

## 2023-01-09 DIAGNOSIS — E785 Hyperlipidemia, unspecified: Secondary | ICD-10-CM | POA: Diagnosis not present

## 2023-01-09 DIAGNOSIS — Z79899 Other long term (current) drug therapy: Secondary | ICD-10-CM | POA: Diagnosis not present

## 2023-01-15 DIAGNOSIS — B351 Tinea unguium: Secondary | ICD-10-CM | POA: Diagnosis not present

## 2023-01-23 DIAGNOSIS — B351 Tinea unguium: Secondary | ICD-10-CM | POA: Diagnosis not present

## 2023-02-08 DIAGNOSIS — H34832 Tributary (branch) retinal vein occlusion, left eye, with macular edema: Secondary | ICD-10-CM | POA: Diagnosis not present

## 2023-02-08 DIAGNOSIS — H2513 Age-related nuclear cataract, bilateral: Secondary | ICD-10-CM | POA: Diagnosis not present

## 2023-02-08 DIAGNOSIS — H43812 Vitreous degeneration, left eye: Secondary | ICD-10-CM | POA: Diagnosis not present

## 2023-02-08 DIAGNOSIS — H18513 Endothelial corneal dystrophy, bilateral: Secondary | ICD-10-CM | POA: Diagnosis not present

## 2023-02-20 DIAGNOSIS — B351 Tinea unguium: Secondary | ICD-10-CM | POA: Diagnosis not present

## 2023-03-02 DIAGNOSIS — E785 Hyperlipidemia, unspecified: Secondary | ICD-10-CM | POA: Diagnosis not present

## 2023-03-02 DIAGNOSIS — Z79899 Other long term (current) drug therapy: Secondary | ICD-10-CM | POA: Diagnosis not present

## 2023-03-06 DIAGNOSIS — B351 Tinea unguium: Secondary | ICD-10-CM | POA: Diagnosis not present

## 2023-03-20 DIAGNOSIS — B351 Tinea unguium: Secondary | ICD-10-CM | POA: Diagnosis not present

## 2023-04-04 DIAGNOSIS — B351 Tinea unguium: Secondary | ICD-10-CM | POA: Diagnosis not present

## 2023-04-26 DIAGNOSIS — B351 Tinea unguium: Secondary | ICD-10-CM | POA: Diagnosis not present

## 2023-04-26 DIAGNOSIS — L821 Other seborrheic keratosis: Secondary | ICD-10-CM | POA: Diagnosis not present

## 2023-04-27 DIAGNOSIS — B351 Tinea unguium: Secondary | ICD-10-CM | POA: Diagnosis not present

## 2023-05-10 DIAGNOSIS — H43391 Other vitreous opacities, right eye: Secondary | ICD-10-CM | POA: Diagnosis not present

## 2023-05-10 DIAGNOSIS — H2513 Age-related nuclear cataract, bilateral: Secondary | ICD-10-CM | POA: Diagnosis not present

## 2023-05-10 DIAGNOSIS — H34832 Tributary (branch) retinal vein occlusion, left eye, with macular edema: Secondary | ICD-10-CM | POA: Diagnosis not present

## 2023-05-10 DIAGNOSIS — H43812 Vitreous degeneration, left eye: Secondary | ICD-10-CM | POA: Diagnosis not present

## 2023-06-21 DIAGNOSIS — E785 Hyperlipidemia, unspecified: Secondary | ICD-10-CM | POA: Diagnosis not present

## 2023-07-26 DIAGNOSIS — H43391 Other vitreous opacities, right eye: Secondary | ICD-10-CM | POA: Diagnosis not present

## 2023-07-26 DIAGNOSIS — H269 Unspecified cataract: Secondary | ICD-10-CM | POA: Diagnosis not present

## 2023-07-26 DIAGNOSIS — H34832 Tributary (branch) retinal vein occlusion, left eye, with macular edema: Secondary | ICD-10-CM | POA: Diagnosis not present

## 2023-07-26 DIAGNOSIS — H43812 Vitreous degeneration, left eye: Secondary | ICD-10-CM | POA: Diagnosis not present

## 2023-07-27 DIAGNOSIS — B351 Tinea unguium: Secondary | ICD-10-CM | POA: Diagnosis not present

## 2023-08-22 DIAGNOSIS — E785 Hyperlipidemia, unspecified: Secondary | ICD-10-CM | POA: Diagnosis not present

## 2023-08-31 DIAGNOSIS — E785 Hyperlipidemia, unspecified: Secondary | ICD-10-CM | POA: Diagnosis not present

## 2023-08-31 DIAGNOSIS — H34832 Tributary (branch) retinal vein occlusion, left eye, with macular edema: Secondary | ICD-10-CM | POA: Diagnosis not present

## 2023-08-31 DIAGNOSIS — I359 Nonrheumatic aortic valve disorder, unspecified: Secondary | ICD-10-CM | POA: Diagnosis not present

## 2023-08-31 DIAGNOSIS — I10 Essential (primary) hypertension: Secondary | ICD-10-CM | POA: Diagnosis not present

## 2023-09-07 DIAGNOSIS — N760 Acute vaginitis: Secondary | ICD-10-CM | POA: Diagnosis not present

## 2023-09-07 DIAGNOSIS — B9689 Other specified bacterial agents as the cause of diseases classified elsewhere: Secondary | ICD-10-CM | POA: Diagnosis not present

## 2023-09-07 DIAGNOSIS — I10 Essential (primary) hypertension: Secondary | ICD-10-CM | POA: Diagnosis not present

## 2023-09-07 DIAGNOSIS — E785 Hyperlipidemia, unspecified: Secondary | ICD-10-CM | POA: Diagnosis not present

## 2023-11-02 DIAGNOSIS — R7309 Other abnormal glucose: Secondary | ICD-10-CM | POA: Diagnosis not present

## 2023-11-02 DIAGNOSIS — E785 Hyperlipidemia, unspecified: Secondary | ICD-10-CM | POA: Diagnosis not present

## 2023-11-02 DIAGNOSIS — I10 Essential (primary) hypertension: Secondary | ICD-10-CM | POA: Diagnosis not present

## 2023-11-08 DIAGNOSIS — L821 Other seborrheic keratosis: Secondary | ICD-10-CM | POA: Diagnosis not present

## 2023-11-08 DIAGNOSIS — C44329 Squamous cell carcinoma of skin of other parts of face: Secondary | ICD-10-CM | POA: Diagnosis not present

## 2023-11-15 DIAGNOSIS — C44329 Squamous cell carcinoma of skin of other parts of face: Secondary | ICD-10-CM | POA: Diagnosis not present

## 2023-11-15 DIAGNOSIS — H34832 Tributary (branch) retinal vein occlusion, left eye, with macular edema: Secondary | ICD-10-CM | POA: Diagnosis not present

## 2023-11-15 DIAGNOSIS — H18513 Endothelial corneal dystrophy, bilateral: Secondary | ICD-10-CM | POA: Diagnosis not present

## 2023-11-15 DIAGNOSIS — H2513 Age-related nuclear cataract, bilateral: Secondary | ICD-10-CM | POA: Diagnosis not present

## 2023-11-15 DIAGNOSIS — H43391 Other vitreous opacities, right eye: Secondary | ICD-10-CM | POA: Diagnosis not present

## 2023-12-28 DIAGNOSIS — E785 Hyperlipidemia, unspecified: Secondary | ICD-10-CM | POA: Diagnosis not present

## 2023-12-28 DIAGNOSIS — Z79899 Other long term (current) drug therapy: Secondary | ICD-10-CM | POA: Diagnosis not present

## 2024-01-03 DIAGNOSIS — J069 Acute upper respiratory infection, unspecified: Secondary | ICD-10-CM | POA: Diagnosis not present

## 2024-01-03 DIAGNOSIS — Z1211 Encounter for screening for malignant neoplasm of colon: Secondary | ICD-10-CM | POA: Diagnosis not present

## 2024-02-19 DIAGNOSIS — H43391 Other vitreous opacities, right eye: Secondary | ICD-10-CM | POA: Diagnosis not present

## 2024-02-19 DIAGNOSIS — H34832 Tributary (branch) retinal vein occlusion, left eye, with macular edema: Secondary | ICD-10-CM | POA: Diagnosis not present

## 2024-02-19 DIAGNOSIS — Z01818 Encounter for other preprocedural examination: Secondary | ICD-10-CM | POA: Diagnosis not present

## 2024-02-19 DIAGNOSIS — H43812 Vitreous degeneration, left eye: Secondary | ICD-10-CM | POA: Diagnosis not present

## 2024-02-19 DIAGNOSIS — H18513 Endothelial corneal dystrophy, bilateral: Secondary | ICD-10-CM | POA: Diagnosis not present

## 2024-02-21 DIAGNOSIS — B351 Tinea unguium: Secondary | ICD-10-CM | POA: Diagnosis not present

## 2024-03-21 DIAGNOSIS — Z800 Family history of malignant neoplasm of digestive organs: Secondary | ICD-10-CM | POA: Diagnosis not present

## 2024-03-21 DIAGNOSIS — Z1211 Encounter for screening for malignant neoplasm of colon: Secondary | ICD-10-CM | POA: Diagnosis not present

## 2024-03-21 DIAGNOSIS — D121 Benign neoplasm of appendix: Secondary | ICD-10-CM | POA: Diagnosis not present

## 2024-03-21 DIAGNOSIS — K648 Other hemorrhoids: Secondary | ICD-10-CM | POA: Diagnosis not present

## 2024-03-21 DIAGNOSIS — D800 Hereditary hypogammaglobulinemia: Secondary | ICD-10-CM | POA: Diagnosis not present

## 2024-03-21 DIAGNOSIS — D123 Benign neoplasm of transverse colon: Secondary | ICD-10-CM | POA: Diagnosis not present

## 2024-04-22 DIAGNOSIS — Z79899 Other long term (current) drug therapy: Secondary | ICD-10-CM | POA: Diagnosis not present

## 2024-04-22 DIAGNOSIS — E785 Hyperlipidemia, unspecified: Secondary | ICD-10-CM | POA: Diagnosis not present

## 2024-05-01 DIAGNOSIS — D234 Other benign neoplasm of skin of scalp and neck: Secondary | ICD-10-CM | POA: Diagnosis not present

## 2024-05-01 DIAGNOSIS — L820 Inflamed seborrheic keratosis: Secondary | ICD-10-CM | POA: Diagnosis not present

## 2024-05-30 DIAGNOSIS — B351 Tinea unguium: Secondary | ICD-10-CM | POA: Diagnosis not present

## 2024-06-05 DIAGNOSIS — R748 Abnormal levels of other serum enzymes: Secondary | ICD-10-CM | POA: Diagnosis not present

## 2024-06-17 DIAGNOSIS — H34832 Tributary (branch) retinal vein occlusion, left eye, with macular edema: Secondary | ICD-10-CM | POA: Diagnosis not present

## 2024-06-17 DIAGNOSIS — H269 Unspecified cataract: Secondary | ICD-10-CM | POA: Diagnosis not present

## 2024-06-17 DIAGNOSIS — H18513 Endothelial corneal dystrophy, bilateral: Secondary | ICD-10-CM | POA: Diagnosis not present

## 2024-06-17 DIAGNOSIS — H43812 Vitreous degeneration, left eye: Secondary | ICD-10-CM | POA: Diagnosis not present

## 2024-07-10 DIAGNOSIS — B351 Tinea unguium: Secondary | ICD-10-CM | POA: Diagnosis not present

## 2024-08-01 DIAGNOSIS — E782 Mixed hyperlipidemia: Secondary | ICD-10-CM | POA: Diagnosis not present

## 2024-08-01 DIAGNOSIS — R945 Abnormal results of liver function studies: Secondary | ICD-10-CM | POA: Diagnosis not present

## 2024-08-22 DIAGNOSIS — R221 Localized swelling, mass and lump, neck: Secondary | ICD-10-CM | POA: Diagnosis not present

## 2024-08-22 DIAGNOSIS — R599 Enlarged lymph nodes, unspecified: Secondary | ICD-10-CM | POA: Diagnosis not present

## 2024-08-25 ENCOUNTER — Ambulatory Visit (HOSPITAL_BASED_OUTPATIENT_CLINIC_OR_DEPARTMENT_OTHER)
Admission: RE | Admit: 2024-08-25 | Discharge: 2024-08-25 | Disposition: A | Discharge status: Home / Self Care | Source: Ambulatory Visit

## 2024-08-25 ENCOUNTER — Other Ambulatory Visit (HOSPITAL_BASED_OUTPATIENT_CLINIC_OR_DEPARTMENT_OTHER): Payer: Self-pay

## 2024-08-25 DIAGNOSIS — R221 Localized swelling, mass and lump, neck: Secondary | ICD-10-CM | POA: Diagnosis not present

## 2024-08-25 DIAGNOSIS — R590 Localized enlarged lymph nodes: Secondary | ICD-10-CM | POA: Diagnosis not present

## 2024-08-25 DIAGNOSIS — M542 Cervicalgia: Secondary | ICD-10-CM | POA: Diagnosis not present

## 2024-09-04 DIAGNOSIS — H34832 Tributary (branch) retinal vein occlusion, left eye, with macular edema: Secondary | ICD-10-CM | POA: Diagnosis not present

## 2024-09-09 DIAGNOSIS — M542 Cervicalgia: Secondary | ICD-10-CM | POA: Diagnosis not present
